# Patient Record
Sex: Male | Born: 1975 | Race: White | Hispanic: No | Marital: Married | Smoking: Never smoker
Health system: Southern US, Community
[De-identification: ages and names within clinical notes are randomized; demographics above are authoritative.]

## PROBLEM LIST (undated history)

## (undated) HISTORY — PX: HERNIA REPAIR: SHX51

---

## 2017-10-25 ENCOUNTER — Emergency Department (HOSPITAL_COMMUNITY)
Admission: EM | Admit: 2017-10-25 | Discharge: 2017-10-26 | Discharge status: Against Medical Advice | Payer: Self-pay | Attending: Emergency Medicine | Admitting: Emergency Medicine

## 2017-10-25 ENCOUNTER — Encounter (HOSPITAL_COMMUNITY): Payer: Self-pay | Admitting: Emergency Medicine

## 2017-10-25 DIAGNOSIS — K56609 Unspecified intestinal obstruction, unspecified as to partial versus complete obstruction: Secondary | ICD-10-CM

## 2017-10-25 DIAGNOSIS — Z79899 Other long term (current) drug therapy: Secondary | ICD-10-CM | POA: Insufficient documentation

## 2017-10-25 DIAGNOSIS — R1033 Periumbilical pain: Secondary | ICD-10-CM | POA: Insufficient documentation

## 2017-10-25 DIAGNOSIS — K56699 Other intestinal obstruction unspecified as to partial versus complete obstruction: Secondary | ICD-10-CM | POA: Insufficient documentation

## 2017-10-25 MED ORDER — SODIUM CHLORIDE 0.9 % IV BOLUS (SEPSIS)
1000.0000 mL | Freq: Once | INTRAVENOUS | Status: AC
  Administered 2017-10-26: 1000 mL via INTRAVENOUS

## 2017-10-25 NOTE — ED Triage Notes (Signed)
Patient reports generalized abdominal pain with multiple emesis onset yesterday , no nausea at arrival , seen at [facility] diagnosed with intestinal blockage sent here for further evaluation , pt. declined blood tests/urinalysis at triage .

## 2017-10-25 NOTE — ED Triage Notes (Signed)
Received report from [facility] physicians, pt admitting to ED for intestinal blockage. States hx of same, but cleared up with IV fluids and steroids. Reports abdominal pain, N/V. Brought disc of images taken today. Wanting to speak to MD regarding plan of care, states he doesn't want to stay in hospital because of cost - pt is self pay.

## 2017-10-25 NOTE — ED Provider Notes (Signed)
[facility] EM[facility]ENC[facility] Provider Note   CSN: [id number] Arrival date & time: 10/25/17  1948     History   Chief Complaint Chief Complaint  Patient presents with  . Abdominal Pain    ? Obstruction     HPI Dysen Sergio Hernandez is a 41 y.o. male.  HPI  This is a 41 year old male who presents with abdominal pain.  Patient reports 1 day history of generalized periumbilical abdominal discomfort.  Initially he states that it was 8 out of 10.  Now it is 5 out of 10.  It comes and goes.  Last bowel movement was yesterday.  He had multiple episodes of non-bloody, nonbilious emesis.  Last vomited at 12 noon.  He was seen at an outpatient clinic.  He had an x-ray which he states was concerning for obstruction.  He does have a history of obstruction and was [place] in [place] years ago.  Only known surgery was a hernia repair.  Denies any fevers, urinary symptoms.  History reviewed. No pertinent past medical history.  There are no active problems to display for this patient.   Past Surgical History:  Procedure Laterality Date  . HERNIA REPAIR         Home Medications    Prior to Admission medications   Medication Sig Start Date End Date Taking? Authorizing Provider  Ascorbic Acid (VITAMIN C) 100 MG tablet Take 100 mg daily by mouth.   Yes [provider]  lansoprazole (PREVACID) 15 MG capsule Take 15 mg daily at 12 noon by mouth.   Yes [provider]  HYDROcodone-acetaminophen (NORCO/VICODIN) 5-325 MG tablet Take 1-2 tablets every 6 (six) hours as needed by mouth. 10/26/17   Kaoir Loree, Mayer Maskerourtney F, MD  ondansetron (ZOFRAN ODT) 4 MG disintegrating tablet Take 1 tablet (4 mg total) every 8 (eight) hours as needed by mouth for nausea or vomiting. 10/26/17   Fabiano Ginley, Mayer Maskerourtney F, MD    Family History No family history on file.  Social History Social History   Tobacco Use  . Smoking status: Never Smoker  . Smokeless tobacco: Never Used    Substance Use Topics  . Alcohol use: No    Frequency: Never  . Drug use: No     Allergies   Patient has no known allergies.   Review of Systems Review of Systems  Constitutional: Negative for fever.  Respiratory: Negative for shortness of breath.   Cardiovascular: Negative for chest pain.  Gastrointestinal: Positive for abdominal pain, nausea and vomiting. Negative for constipation and diarrhea.  Genitourinary: Negative for dysuria.  All other systems reviewed and are negative.    Physical Exam Updated Vital Signs BP 133/88   Pulse 65   Temp 98.6 F (37 C) (Oral)   Resp 18   Ht 6\' 4"  (1.93 m)   Wt 90.7 kg (200 lb)   SpO2 95%   BMI 24.34 kg/m   Physical Exam  Constitutional: He is oriented to person, place, and time. He appears well-developed and well-nourished. He does not appear ill.  HENT:  Head: Normocephalic and atraumatic.  Neck: Neck supple.  Cardiovascular: Normal rate, regular rhythm and normal heart sounds.  No murmur heard. Pulmonary/Chest: Effort normal and breath sounds normal. No respiratory distress. He has no wheezes.  Abdominal: Soft. Bowel sounds are normal. There is tenderness in the periumbilical area. There is no rebound and negative Murphy's sign. No hernia.  Musculoskeletal: He exhibits no edema.  Lymphadenopathy:    He has no  cervical adenopathy.  Neurological: He is alert and oriented to person, place, and time.  Skin: Skin is warm and dry.  Psychiatric: He has a normal mood and affect.  Nursing note and vitals reviewed.    ED Treatments / Results  Labs (all labs ordered are listed, but only abnormal results are displayed) Labs Reviewed  CBC WITH DIFFERENTIAL/PLATELET - Abnormal; Notable for the following components:      Result Value   WBC 13.4 (*)    Neutro Abs 10.1 (*)    All other components within normal limits  COMPREHENSIVE METABOLIC PANEL - Abnormal; Notable for the following components:   Sodium 132 (*)    Chloride 97  (*)    Glucose, Bld 113 (*)    ALT 15 (*)    All other components within normal limits    EKG  EKG Interpretation None       Radiology Dg Abdomen Acute W/chest  Result Date: 10/26/2017 CLINICAL DATA:  Abdominal pain EXAM: DG ABDOMEN ACUTE W/ 1V CHEST COMPARISON:  10/25/2017 FINDINGS: Single-view chest demonstrates no acute consolidation or effusion. Normal cardiomediastinal silhouette. No pneumothorax. Supine and upright views of the abdomen demonstrate no free air beneath the diaphragm. There are dilated loops of central small bowel measuring up to 5 cm with contiguous fluid levels consistent with mechanical small bowel obstruction. Scattered bowel gas in the colon. No abnormal calcification IMPRESSION: 1. No radiographic evidence for acute cardiopulmonary abnormality 2. Findings consistent with small bowel obstruction. Electronically Signed   By: Jasmine Pang M.D.   On: 10/26/2017 00:23    Procedures Procedures (including critical care time)  Medications Ordered in ED Medications  sodium chloride 0.9 % bolus 1,000 mL (0 mLs Intravenous Stopped 10/26/17 0102)     Initial Impression / Assessment and Plan / ED Course  I have reviewed the triage vital signs and the nursing notes.  Pertinent labs & imaging results that were available during my care of the patient were reviewed by me and considered in my medical decision making (see chart for details).  Clinical Course as of Oct 27 123  Wed Oct 26, 2017  1610 I had an extensive discussion with the patient regarding his x-ray.  X-ray is concerning for obstructive pattern.  Patient is very concerned about cost and states that he feels somewhat better.  He has not vomited in over 12 hours.  Discussed with him that my recommendation would be admission for observation for clearance of possible obstruction.  Will attempt to avoid CT scanning to avoid additional cost.  Patient would really like to go home.  I discussed with him that this is  not standard of care but that I understand his concerns regarding cost.  Given that he feels improved, will attempt a p.o. challenge.  If he in fact does decide to go home, he will need to sign out AGAINST MEDICAL ADVICE but was encouraged to return if he has worsening symptoms.  [CH]    Clinical Course User Index [CH] Sallie Staron, Mayer Masker, MD    Patient presents with concerns for abdominal pain and likely obstruction based on x-rays at outside facility.  Currently nontoxic-appearing.  Vital signs are reassuring.  Abdominal exam is fairly benign.  He has not vomited in 12 hours.  Patient is very concerned regarding cost of workup and possible admission.  I discussed with him that we would minimize this as much as possible to keep him safe.  Patient was given fluids.  Basic lab  work obtained.  Nonspecific leukocytosis.  Otherwise fairly reassuring.  X-ray confirms likely obstructive pattern.  Patient has not had any vomiting and has not required any pain medication.  I discussed with him foregoing CT scan at this time for observation admission.  See detailed discussion above.  Patient would like to be discharged home.  I discussed with him that he would have to leave AGAINST MEDICAL ADVICE as my recommendation and standard of care would be admission and observation.  Patient understands the risks and benefits.  He is able to tolerate some fluids without vomiting.  Recommend clear diet for the next 24-48 hours.  Patient will be given a short course of pain and nausea medication.  He was encouraged to return for any new or worsening symptoms.  After history, exam, and medical workup I feel the patient has been appropriately medically screened and is safe for discharge home. Pertinent diagnoses were discussed with the patient. Patient was given return precautions.   Final Clinical Impressions(s) / ED Diagnoses   Final diagnoses:  Periumbilical abdominal pain  SBO (small bowel obstruction) [facility](HCC)    ED  Discharge Orders        Ordered    HYDROcodone-acetaminophen (NORCO/VICODIN) 5-325 MG tablet  Every 6 hours PRN     10/26/17 0122    ondansetron (ZOFRAN ODT) 4 MG disintegrating tablet  Every 8 hours PRN     10/26/17 0122       Shon BatonHorton, Vercie Pokorny F, MD 10/26/17 0126

## 2017-10-26 ENCOUNTER — Emergency Department (HOSPITAL_COMMUNITY): Payer: Self-pay

## 2017-10-26 LAB — CBC WITH DIFFERENTIAL/PLATELET
BASOS ABS: 0 10*3/uL (ref 0.0–0.1)
Basophils Relative: 0 %
EOS PCT: 0 %
Eosinophils Absolute: 0.1 10*3/uL (ref 0.0–0.7)
HCT: 45.1 % (ref 39.0–52.0)
Hemoglobin: 14.7 g/dL (ref 13.0–17.0)
LYMPHS PCT: 18 %
Lymphs Abs: 2.4 10*3/uL (ref 0.7–4.0)
MCH: 26.4 pg (ref 26.0–34.0)
MCHC: 32.6 g/dL (ref 30.0–36.0)
MCV: 81 fL (ref 78.0–100.0)
MONO ABS: 0.8 10*3/uL (ref 0.1–1.0)
Monocytes Relative: 6 %
Neutro Abs: 10.1 10*3/uL — ABNORMAL HIGH (ref 1.7–7.7)
Neutrophils Relative %: 76 %
PLATELETS: 294 10*3/uL (ref 150–400)
RBC: 5.57 MIL/uL (ref 4.22–5.81)
RDW: 13.8 % (ref 11.5–15.5)
WBC: 13.4 10*3/uL — ABNORMAL HIGH (ref 4.0–10.5)

## 2017-10-26 LAB — COMPREHENSIVE METABOLIC PANEL
ALT: 15 U/L — ABNORMAL LOW (ref 17–63)
ANION GAP: 9 (ref 5–15)
AST: 17 U/L (ref 15–41)
Albumin: 3.6 g/dL (ref 3.5–5.0)
Alkaline Phosphatase: 55 U/L (ref 38–126)
BUN: 7 mg/dL (ref 6–20)
CHLORIDE: 97 mmol/L — AB (ref 101–111)
CO2: 26 mmol/L (ref 22–32)
Calcium: 9 mg/dL (ref 8.9–10.3)
Creatinine, Ser: 1.08 mg/dL (ref 0.61–1.24)
GFR calc non Af Amer: >60 mL/min (ref >60)
Glucose, Bld: 113 mg/dL — ABNORMAL HIGH (ref 65–99)
POTASSIUM: 4.2 mmol/L (ref 3.5–5.1)
Sodium: 132 mmol/L — ABNORMAL LOW (ref 135–145)
Total Bilirubin: 0.9 mg/dL (ref 0.3–1.2)
Total Protein: 7.5 g/dL (ref 6.5–8.1)

## 2017-10-26 IMAGING — CR DG ABDOMEN ACUTE W/ 1V CHEST
4 series · 4 of 4 positions shown · non-contrast
Comparison: [DATE]

CLINICAL DATA: Abdominal pain

EXAM:
DG ABDOMEN ACUTE W/ 1V CHEST

[chest pa]
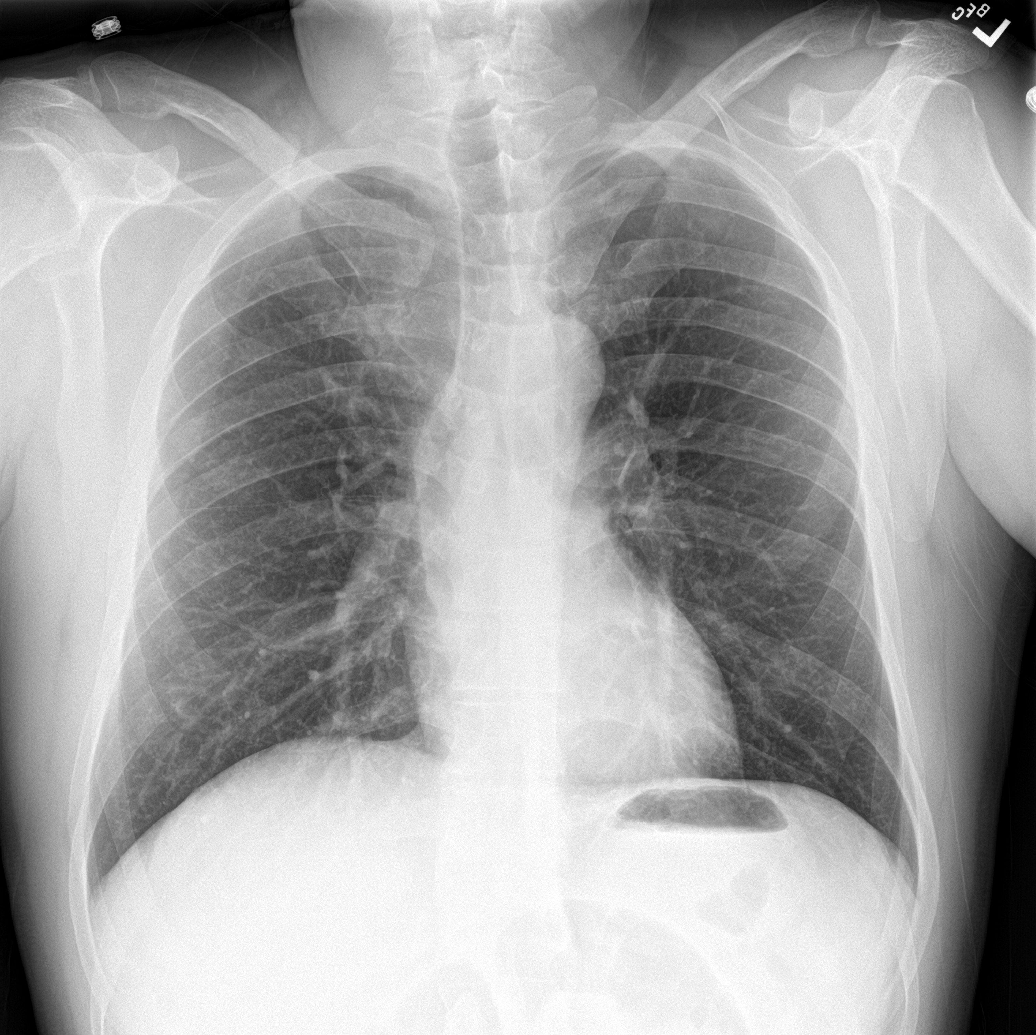

[abdomen erect]
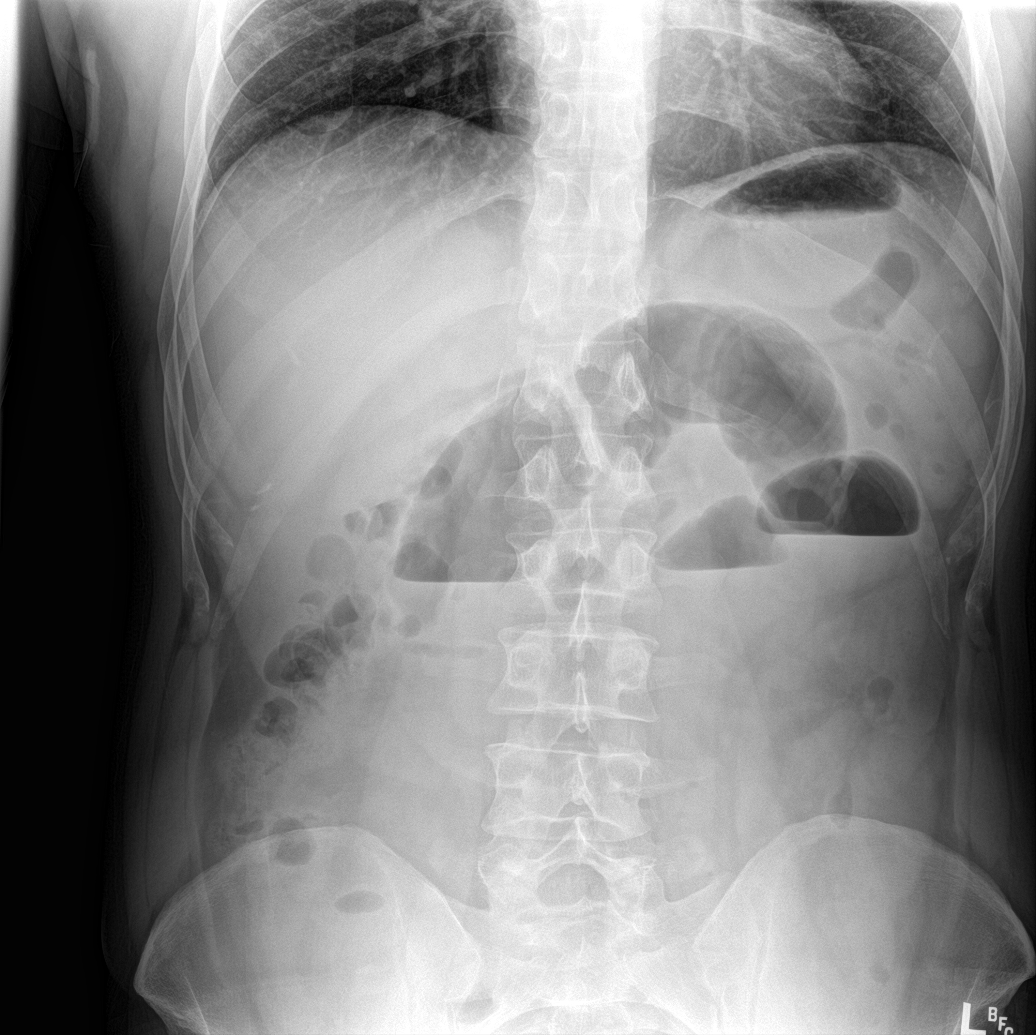

[abdomen supine (1 of 2)]
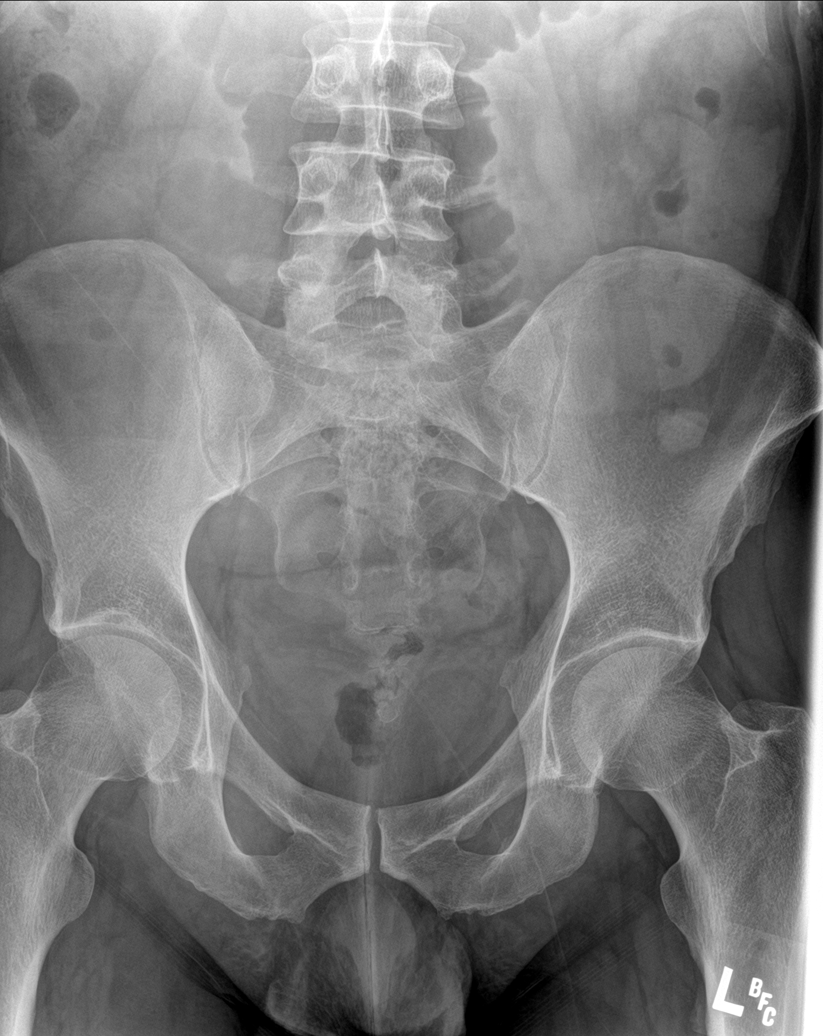

[abdomen supine (2 of 2)]
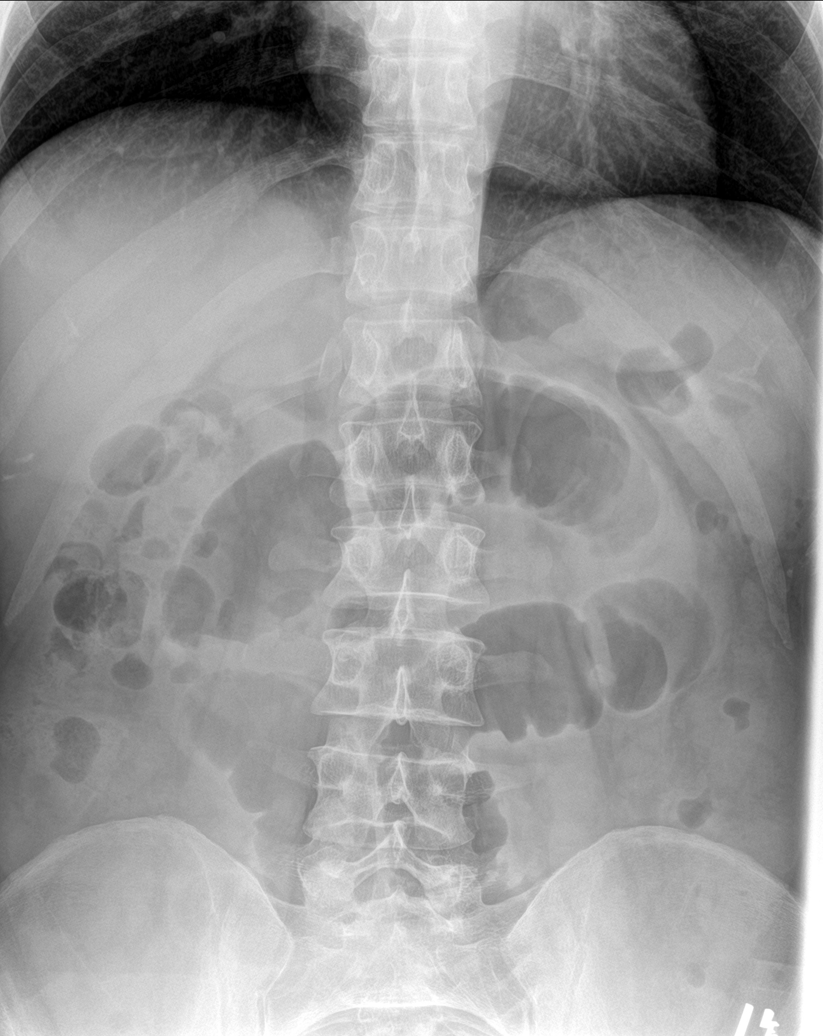

[4 of 4 positions shown; findings below may reference images not displayed]

FINDINGS: Single-view chest demonstrates no acute consolidation or effusion.
Normal cardiomediastinal silhouette. No pneumothorax.

Supine and upright views of the abdomen demonstrate no free air
beneath the diaphragm. There are dilated loops of central small
bowel measuring up to 5 cm with contiguous fluid levels consistent
with mechanical small bowel obstruction. Scattered bowel gas in the
colon. No abnormal calcification
IMPRESSION: 1. No radiographic evidence for acute cardiopulmonary abnormality
2. Findings consistent with small bowel obstruction.

## 2017-10-26 MED ORDER — HYDROCODONE-ACETAMINOPHEN 5-325 MG PO TABS: 1.0000 | ORAL_TABLET | Freq: Four times a day (QID) | ORAL | 0 refills | Status: DC | PRN

## 2017-10-26 MED ORDER — ONDANSETRON 4 MG PO TBDP: 4.0000 mg | ORAL_TABLET | Freq: Three times a day (TID) | ORAL | 0 refills | Status: DC | PRN

## 2017-10-26 NOTE — ED Notes (Signed)
Pt tolerating fluids, EDP aware. Pt will not stay d/t financial cost. Pt will be signing out AMA. Pt understands the risks of leaving.

## 2017-10-26 NOTE — Discharge Instructions (Signed)
You are seen today for abdominal pain.  You likely have a early small bowel obstruction.  The recommendation was to admit you to the hospital.  However, you elected to go home.  Clear diet recommended for the next 24-48 hours.  Broths, soups, fluids encouraged.  You will be given some pain and nausea medication.  If you have any new or worsening symptoms including refractory vomiting, worsening pain, you should be reevaluated immediately.

## 2017-10-26 NOTE — ED Notes (Signed)
Pt given water for PO challenge, will continue to monitor.

## 2017-10-26 NOTE — ED Notes (Signed)
EDP at bedside  

## 2021-07-17 ENCOUNTER — Emergency Department (HOSPITAL_COMMUNITY)
Admission: EM | Admit: 2021-07-17 | Discharge: 2021-07-17 | Disposition: A | Discharge status: Home / Self Care | Payer: Federal, State, Local not specified - PPO | Attending: Emergency Medicine | Admitting: Emergency Medicine

## 2021-07-17 ENCOUNTER — Emergency Department (HOSPITAL_COMMUNITY): Payer: Federal, State, Local not specified - PPO

## 2021-07-17 ENCOUNTER — Encounter (HOSPITAL_COMMUNITY): Payer: Self-pay | Admitting: *Deleted

## 2021-07-17 ENCOUNTER — Other Ambulatory Visit: Payer: Self-pay

## 2021-07-17 DIAGNOSIS — S6992XA Unspecified injury of left wrist, hand and finger(s), initial encounter: Secondary | ICD-10-CM | POA: Diagnosis not present

## 2021-07-17 DIAGNOSIS — S299XXA Unspecified injury of thorax, initial encounter: Secondary | ICD-10-CM | POA: Diagnosis not present

## 2021-07-17 DIAGNOSIS — R519 Headache, unspecified: Secondary | ICD-10-CM | POA: Insufficient documentation

## 2021-07-17 DIAGNOSIS — M791 Myalgia, unspecified site: Secondary | ICD-10-CM | POA: Diagnosis not present

## 2021-07-17 DIAGNOSIS — R110 Nausea: Secondary | ICD-10-CM | POA: Diagnosis not present

## 2021-07-17 DIAGNOSIS — Y92410 Unspecified street and highway as the place of occurrence of the external cause: Secondary | ICD-10-CM | POA: Diagnosis not present

## 2021-07-17 DIAGNOSIS — S4992XA Unspecified injury of left shoulder and upper arm, initial encounter: Secondary | ICD-10-CM | POA: Insufficient documentation

## 2021-07-17 DIAGNOSIS — M7918 Myalgia, other site: Secondary | ICD-10-CM
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
## 2021-07-17 MED ORDER — IBUPROFEN 600 MG PO TABS: 600.0000 mg | ORAL_TABLET | Freq: Three times a day (TID) | ORAL | 0 refills | Status: DC | PRN

## 2021-07-17 MED ORDER — CYCLOBENZAPRINE HCL 5 MG PO TABS: 5.0000 mg | ORAL_TABLET | Freq: Three times a day (TID) | ORAL | 0 refills | Status: DC | PRN
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
## 2021-07-31 ENCOUNTER — Encounter (HOSPITAL_BASED_OUTPATIENT_CLINIC_OR_DEPARTMENT_OTHER): Payer: Self-pay

## 2021-07-31 ENCOUNTER — Encounter: Payer: Self-pay | Admitting: Family Medicine

## 2021-07-31 ENCOUNTER — Ambulatory Visit: Payer: Federal, State, Local not specified - PPO | Admitting: Family Medicine

## 2021-07-31 ENCOUNTER — Encounter: Payer: Self-pay | Admitting: Neurology

## 2021-07-31 ENCOUNTER — Other Ambulatory Visit: Payer: Self-pay

## 2021-07-31 ENCOUNTER — Ambulatory Visit (HOSPITAL_BASED_OUTPATIENT_CLINIC_OR_DEPARTMENT_OTHER)
Admission: RE | Admit: 2021-07-31 | Discharge: 2021-07-31 | Disposition: A | Discharge status: Home / Self Care | Payer: Federal, State, Local not specified - PPO | Source: Ambulatory Visit | Attending: Family Medicine | Admitting: Family Medicine

## 2021-07-31 ENCOUNTER — Telehealth: Payer: Self-pay

## 2021-07-31 ENCOUNTER — Ambulatory Visit (INDEPENDENT_AMBULATORY_CARE_PROVIDER_SITE_OTHER)
Admission: RE | Admit: 2021-07-31 | Discharge: 2021-07-31 | Disposition: A | Discharge status: Home / Self Care | Payer: Federal, State, Local not specified - PPO | Source: Ambulatory Visit | Attending: Family Medicine | Admitting: Family Medicine

## 2021-07-31 VITALS — BP 128/74 | HR 69 | Temp 98.1°F | Resp 17 | Ht 76.0 in | Wt 189.6 lb

## 2021-07-31 DIAGNOSIS — R110 Nausea: Secondary | ICD-10-CM

## 2021-07-31 DIAGNOSIS — R531 Weakness: Secondary | ICD-10-CM

## 2021-07-31 DIAGNOSIS — H538 Other visual disturbances: Secondary | ICD-10-CM

## 2021-07-31 DIAGNOSIS — R1084 Generalized abdominal pain: Secondary | ICD-10-CM | POA: Diagnosis not present

## 2021-07-31 DIAGNOSIS — M898X1 Other specified disorders of bone, shoulder: Secondary | ICD-10-CM

## 2021-07-31 DIAGNOSIS — M25532 Pain in left wrist: Secondary | ICD-10-CM

## 2021-07-31 DIAGNOSIS — G44319 Acute post-traumatic headache, not intractable: Secondary | ICD-10-CM | POA: Insufficient documentation

## 2021-07-31 DIAGNOSIS — H9312 Tinnitus, left ear: Secondary | ICD-10-CM

## 2021-07-31 DIAGNOSIS — M25512 Pain in left shoulder: Secondary | ICD-10-CM | POA: Diagnosis not present

## 2021-07-31 LAB — COMPREHENSIVE METABOLIC PANEL
ALT: 14 U/L (ref 0–53)
AST: 15 U/L (ref 0–37)
Albumin: 4.1 g/dL (ref 3.5–5.2)
Alkaline Phosphatase: 52 U/L (ref 39–117)
BUN: 11 mg/dL (ref 6–23)
CO2: 28 mEq/L (ref 19–32)
Calcium: 9.4 mg/dL (ref 8.4–10.5)
Chloride: 102 mEq/L (ref 96–112)
Creatinine, Ser: 1.03 mg/dL (ref 0.40–1.50)
GFR: 86.85 mL/min (ref >60.00)
Glucose, Bld: 79 mg/dL (ref 70–99)
Potassium: 4.7 mEq/L (ref 3.5–5.1)
Sodium: 139 mEq/L (ref 135–145)
Total Bilirubin: 0.6 mg/dL (ref 0.2–1.2)
Total Protein: 7.4 g/dL (ref 6.0–8.3)

## 2021-07-31 LAB — CBC
HCT: 44.7 % (ref 39.0–52.0)
Hemoglobin: 14.6 g/dL (ref 13.0–17.0)
MCHC: 32.7 g/dL (ref 30.0–36.0)
MCV: 81.9 fl (ref 78.0–100.0)
Platelets: 332 10*3/uL (ref 150.0–400.0)
RBC: 5.46 Mil/uL (ref 4.22–5.81)
RDW: 13.8 % (ref 11.5–15.5)
WBC: 7.5 10*3/uL (ref 4.0–10.5)

## 2021-07-31 LAB — GLUCOSE, POCT (MANUAL RESULT ENTRY): POC Glucose: 99 mg/dl (ref 70–99)

## 2021-07-31 LAB — LIPASE: Lipase: 29 U/L (ref 11.0–59.0)
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
## 2021-07-31 NOTE — Telephone Encounter (Signed)
Called patient to see what he needed. He states he thought he was supposed to pick up medications from the pharmacy. I don't see any medications that were sent, and note from today doesn't state that medications were sent in. Please advise.

## 2021-07-31 NOTE — Telephone Encounter (Signed)
Pt  is wanting to know if he is to continue taking the medication that he  cyclobenzaprine (FLEXERIL) 5 MG tablet  ibuprofen (ADVIL) 600 MG tablet   Pt call back [phone]
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
## 2021-08-02 MED ORDER — CYCLOBENZAPRINE HCL 5 MG PO TABS: 5.0000 mg | ORAL_TABLET | Freq: Three times a day (TID) | ORAL | 0 refills | Status: DC | PRN

## 2021-08-02 NOTE — Telephone Encounter (Signed)
See xray report for instructions, but I will send temporary rx for flexeril.

## 2021-08-03 NOTE — Telephone Encounter (Signed)
LMOVM for imaging results

## 2021-09-02 ENCOUNTER — Other Ambulatory Visit: Payer: Self-pay

## 2021-09-02 ENCOUNTER — Encounter: Payer: Self-pay | Admitting: Family Medicine

## 2021-09-02 ENCOUNTER — Ambulatory Visit (INDEPENDENT_AMBULATORY_CARE_PROVIDER_SITE_OTHER): Payer: Federal, State, Local not specified - PPO | Admitting: Family Medicine

## 2021-09-02 VITALS — BP 112/70 | HR 63 | Temp 98.3°F | Resp 16 | Ht 76.0 in | Wt 189.0 lb

## 2021-09-02 DIAGNOSIS — G44309 Post-traumatic headache, unspecified, not intractable: Secondary | ICD-10-CM

## 2021-09-02 DIAGNOSIS — H538 Other visual disturbances: Secondary | ICD-10-CM

## 2021-09-02 DIAGNOSIS — G4700 Insomnia, unspecified: Secondary | ICD-10-CM | POA: Diagnosis not present

## 2021-09-02 DIAGNOSIS — R29818 Other symptoms and signs involving the nervous system: Secondary | ICD-10-CM | POA: Diagnosis not present

## 2021-09-02 MED ORDER — AMITRIPTYLINE HCL 10 MG PO TABS: 10.0000 mg | ORAL_TABLET | Freq: Every day | ORAL | 1 refills | Status: DC
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
## 2021-09-04 ENCOUNTER — Other Ambulatory Visit: Payer: Self-pay

## 2021-09-04 ENCOUNTER — Ambulatory Visit (INDEPENDENT_AMBULATORY_CARE_PROVIDER_SITE_OTHER): Payer: Federal, State, Local not specified - PPO | Admitting: Orthopaedic Surgery

## 2021-09-04 ENCOUNTER — Encounter (HOSPITAL_BASED_OUTPATIENT_CLINIC_OR_DEPARTMENT_OTHER): Payer: Self-pay | Admitting: Orthopaedic Surgery

## 2021-09-04 VITALS — BP 124/80 | Ht 69.0 in | Wt 189.0 lb

## 2021-09-04 DIAGNOSIS — M25512 Pain in left shoulder: Secondary | ICD-10-CM | POA: Diagnosis not present
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
## 2021-09-09 ENCOUNTER — Other Ambulatory Visit: Payer: Self-pay

## 2021-09-09 ENCOUNTER — Ambulatory Visit (INDEPENDENT_AMBULATORY_CARE_PROVIDER_SITE_OTHER): Payer: Federal, State, Local not specified - PPO | Admitting: Otolaryngology

## 2021-09-09 DIAGNOSIS — H9312 Tinnitus, left ear: Secondary | ICD-10-CM
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
## 2021-09-10 ENCOUNTER — Encounter (INDEPENDENT_AMBULATORY_CARE_PROVIDER_SITE_OTHER): Payer: Self-pay

## 2021-09-16 ENCOUNTER — Ambulatory Visit: Payer: Federal, State, Local not specified - PPO | Admitting: Family Medicine

## 2021-09-21 ENCOUNTER — Ambulatory Visit (HOSPITAL_BASED_OUTPATIENT_CLINIC_OR_DEPARTMENT_OTHER): Payer: Federal, State, Local not specified - PPO | Admitting: Orthopaedic Surgery

## 2021-09-26 ENCOUNTER — Ambulatory Visit
Admission: RE | Admit: 2021-09-26 | Discharge: 2021-09-26 | Disposition: A | Discharge status: Home / Self Care | Payer: Federal, State, Local not specified - PPO | Source: Ambulatory Visit | Attending: Family Medicine | Admitting: Family Medicine

## 2021-09-26 ENCOUNTER — Ambulatory Visit
Admission: RE | Admit: 2021-09-26 | Discharge: 2021-09-26 | Disposition: A | Discharge status: Home / Self Care | Payer: Federal, State, Local not specified - PPO | Source: Ambulatory Visit | Attending: Orthopaedic Surgery | Admitting: Orthopaedic Surgery

## 2021-09-26 ENCOUNTER — Other Ambulatory Visit: Payer: Self-pay

## 2021-09-26 DIAGNOSIS — R29818 Other symptoms and signs involving the nervous system: Secondary | ICD-10-CM

## 2021-09-26 DIAGNOSIS — M25512 Pain in left shoulder: Secondary | ICD-10-CM

## 2021-09-26 DIAGNOSIS — H538 Other visual disturbances: Secondary | ICD-10-CM

## 2021-09-26 DIAGNOSIS — G44309 Post-traumatic headache, unspecified, not intractable: Secondary | ICD-10-CM
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
## 2021-09-26 IMAGING — MR MR HEAD WO/W CM
12 series · 48 of 48 positions shown · IV contrast (17ml Multihance)
Comparison: CT head [DATE].

CLINICAL DATA: Post-traumatic headache, not intractable,
unspecified chronicity pattern [WW] ([WW]-CM)

Blurred vision, bilateral [WW] ([WW]-CM)
Difficulty balancing [WW] ([WW]-CM)
EXAM:
MRI HEAD WITHOUT AND WITH CONTRAST
TECHNIQUE: Multiplanar, multiecho pulse sequences of the brain and surrounding
structures were obtained without and with intravenous contrast.
CONTRAST:  17mL MULTIHANCE GADOBENATE DIMEGLUMINE 529 MG/ML IV SOLN

[Series 3: T1 · sagittal · 5.0mm · 0.53mm/px · 1 of 25 slices shown]
[im 1/25]
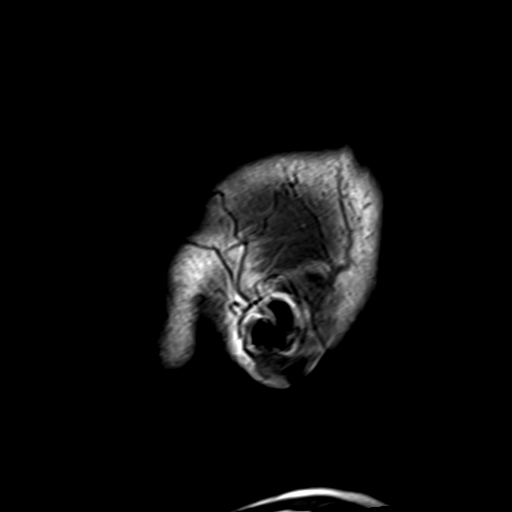

[Series 4: ax ep2d_diff_3 · axial · 3.0mm · 2.11mm/px · z∈[-103,+57]mm · 6 of 109 slices shown]
[im 1/109]
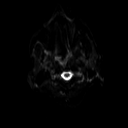
[im 22/109]
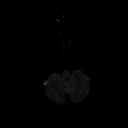
[im 44/109]
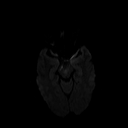
[im 65/109]
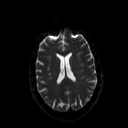
[im 87/109]
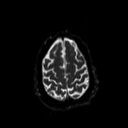
[im 109/109]
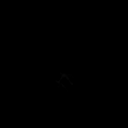

[Series 5: ax ep2d_diff_3_adc · axial · 3.0mm · 2.11mm/px · z∈[-103,+57]mm · 3 of 55 slices shown]
[im 1/55]
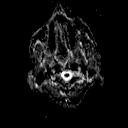
[im 28/55]
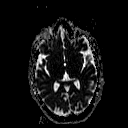
[im 55/55]
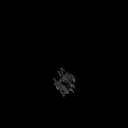

[Series 6: cor ep2d_diff · coronal · 5.0mm · 1.77mm/px · 4 of 61 slices shown]
[im 1/61]
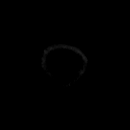
[im 21/61]
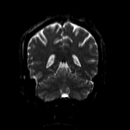
[im 41/61]
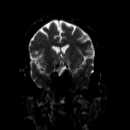
[im 61/61]
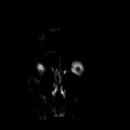

[Series 7: cor ep2d_diff_adc · coronal · 5.0mm · 1.77mm/px · 2 of 31 slices shown]
[im 1/31]
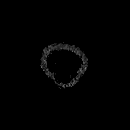
[im 31/31]
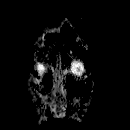

[Series 9: swi_images · axial · 2.0mm · 1.09mm/px · z∈[-99,+56]mm · 5 of 80 slices shown]
[im 1/80]
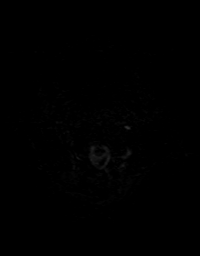
[im 20/80]
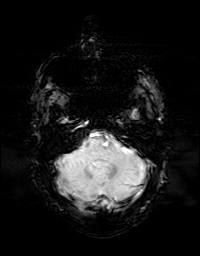
[im 40/80]
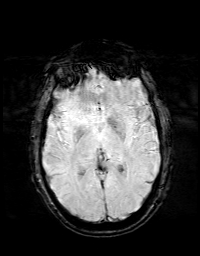
[im 60/80]
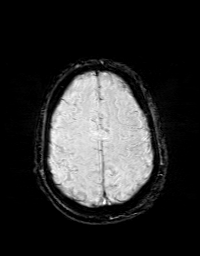
[im 80/80]
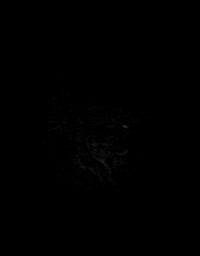

[Series 10: FLAIR · axial · 3.0mm · 0.53mm/px · z∈[-107,+54]mm · 3 of 43 slices shown]
[im 1/43]
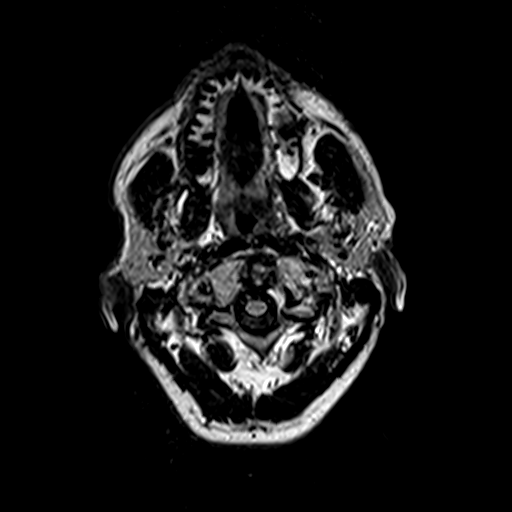
[im 22/43]
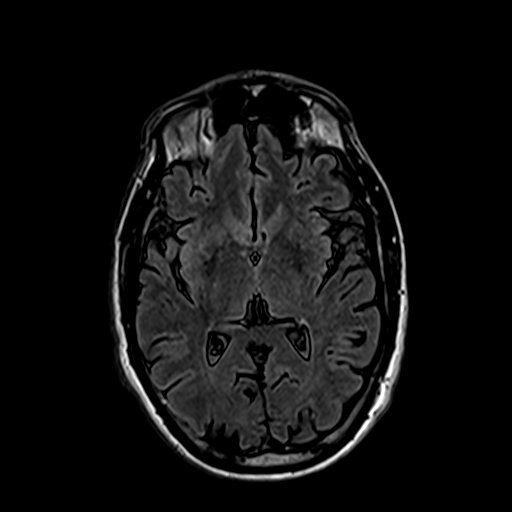
[im 43/43]
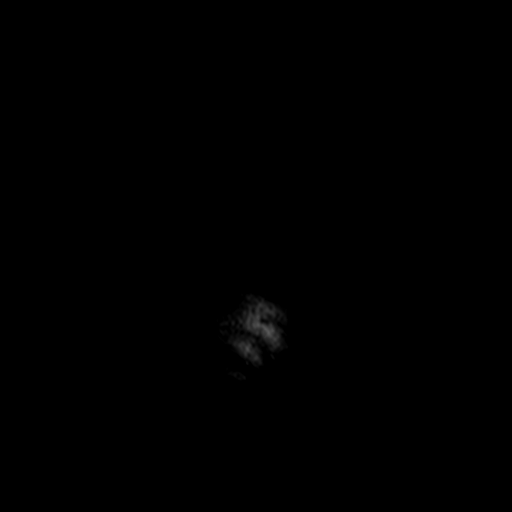

[Series 11: T2 · axial · 5.0mm · 0.84mm/px · z∈[-104,+61]mm · 2 of 29 slices shown (1 of 2)]
[im 1/29]
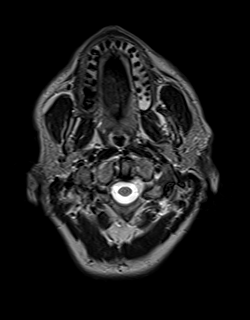
[im 29/29]
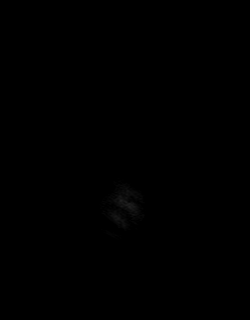

[Series 12: t1_mpr_tra · axial · 1.0mm · 0.84mm/px · z∈[-100,+56]mm · 9 of 160 slices shown]
[im 1/160]
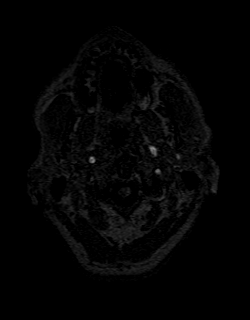
[im 20/160]
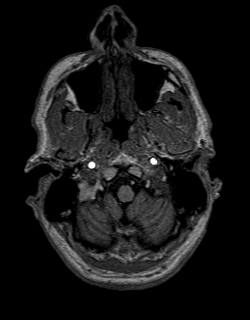
[im 40/160]
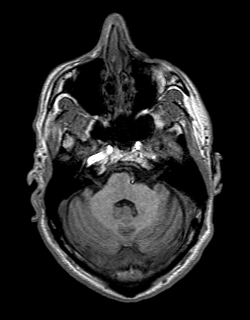
[im 60/160]
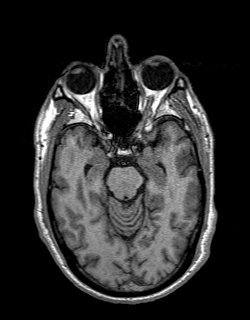
[im 80/160]
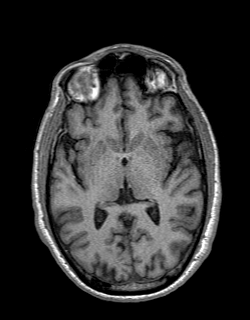
[im 100/160]
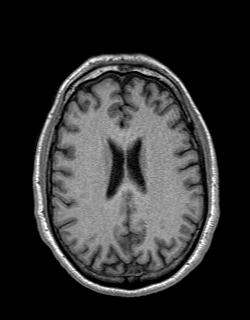
[im 120/160]
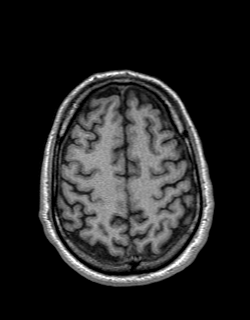
[im 140/160]
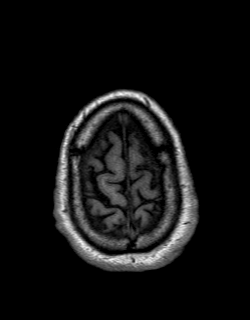
[im 160/160]
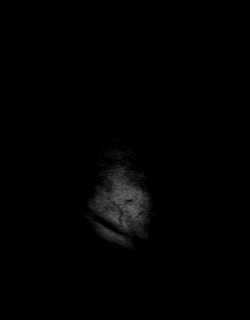

[Series 13: T2 · coronal · 5.0mm · 0.43mm/px · 2 of 33 slices shown (2 of 2)]
[im 1/33]
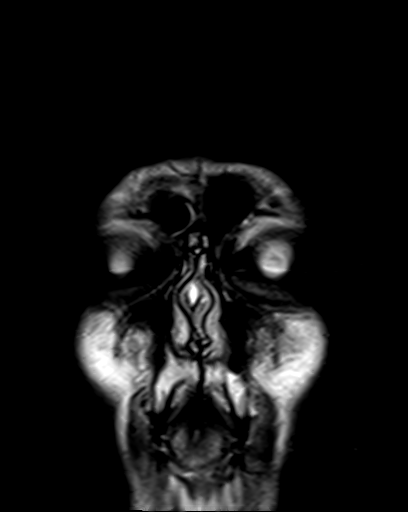
[im 33/33]
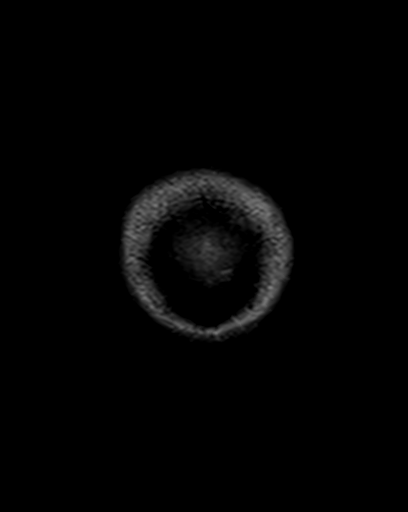

[Series 14: post t1_mpr_tra · axial · 1.0mm · 0.84mm/px · z∈[-100,+56]mm · 9 of 160 slices shown]
[im 1/160]
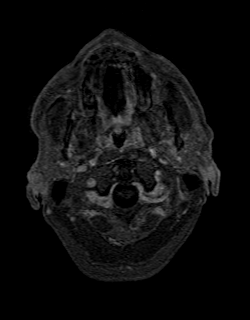
[im 20/160]
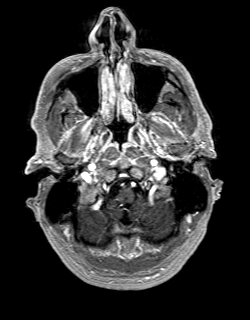
[im 40/160]
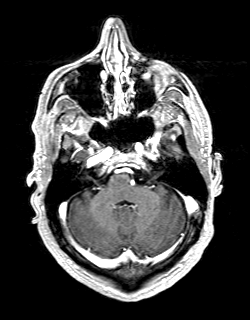
[im 60/160]
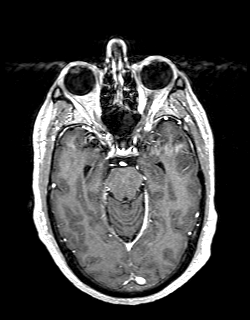
[im 80/160]
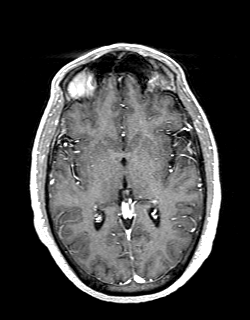
[im 100/160]
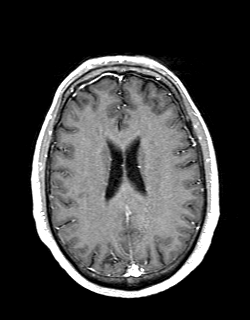
[im 120/160]
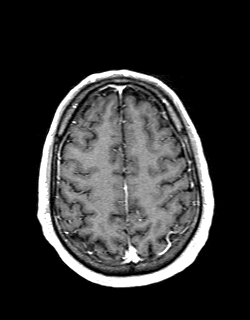
[im 140/160]
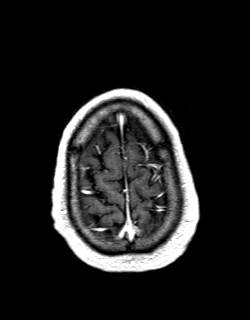
[im 160/160]
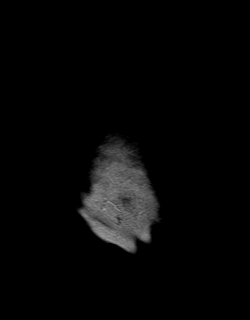

[Series 15: T1 post-contrast · coronal · 5.0mm · 0.90mm/px · 2 of 33 slices shown]
[im 1/33]
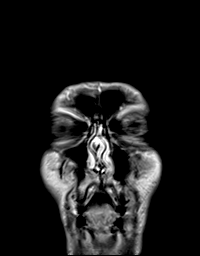
[im 33/33]
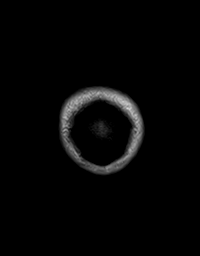

[48 of 48 positions shown; findings below may reference images not displayed]

FINDINGS: Brain: No acute infarction, hemorrhage, hydrocephalus, extra-axial
collection or mass lesion. There are a couple punctate T2
hyperintensities in the white matter, nonspecific but within normal
limits for patient age. No abnormal enhancement.

Vascular: Major arterial flow voids are maintained at the skull
base.

Skull and upper cervical spine: Normal marrow signal.

Sinuses/Orbits: Mild ethmoid air cell mucosal thickening. No
air-fluid levels. Unremarkable orbits.

Other: No mastoid effusions.
IMPRESSION: Normal brain MRI for patient age.  No acute abnormality.

## 2021-09-26 MED ORDER — GADOBENATE DIMEGLUMINE 529 MG/ML IV SOLN
17.0000 mL | Freq: Once | INTRAVENOUS | Status: AC | PRN
  Administered 2021-09-26: 17 mL via INTRAVENOUS

## 2021-09-29 ENCOUNTER — Ambulatory Visit (INDEPENDENT_AMBULATORY_CARE_PROVIDER_SITE_OTHER): Payer: Federal, State, Local not specified - PPO | Admitting: Orthopaedic Surgery

## 2021-09-29 ENCOUNTER — Other Ambulatory Visit: Payer: Self-pay

## 2021-09-29 VITALS — Ht 69.0 in | Wt 190.0 lb

## 2021-09-29 DIAGNOSIS — M25512 Pain in left shoulder: Secondary | ICD-10-CM | POA: Diagnosis not present
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
## 2021-09-30 ENCOUNTER — Ambulatory Visit (INDEPENDENT_AMBULATORY_CARE_PROVIDER_SITE_OTHER): Payer: Federal, State, Local not specified - PPO | Admitting: Family Medicine

## 2021-09-30 ENCOUNTER — Encounter: Payer: Self-pay | Admitting: Family Medicine

## 2021-09-30 VITALS — BP 126/70 | HR 87 | Temp 98.3°F | Resp 16 | Ht 69.0 in | Wt 193.8 lb

## 2021-09-30 DIAGNOSIS — G44319 Acute post-traumatic headache, not intractable: Secondary | ICD-10-CM

## 2021-09-30 DIAGNOSIS — M75112 Incomplete rotator cuff tear or rupture of left shoulder, not specified as traumatic: Secondary | ICD-10-CM

## 2021-09-30 DIAGNOSIS — G4700 Insomnia, unspecified: Secondary | ICD-10-CM | POA: Diagnosis not present

## 2021-09-30 DIAGNOSIS — M25512 Pain in left shoulder: Secondary | ICD-10-CM

## 2021-09-30 DIAGNOSIS — G44309 Post-traumatic headache, unspecified, not intractable: Secondary | ICD-10-CM

## 2021-09-30 MED ORDER — AMITRIPTYLINE HCL 10 MG PO TABS: 10.0000 mg | ORAL_TABLET | Freq: Every day | ORAL | 1 refills | Status: DC
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
## 2021-09-30 NOTE — Patient Instructions (Addendum)
You can try 2 of the amitriptyline at night to see if that helps headaches. If worse side effects on 2 pills, return to one.  Keep follow up with neurologist on 11/2 for your headaches.   Continue to discuss your thoughts and concerns about your shoulder with your surgeon, but physical therapy may be helpful for now as well.   Return to the clinic or go to the nearest emergency room if any of your symptoms worsen or new symptoms occur.  Thanks for coming in today

## 2021-10-13 ENCOUNTER — Other Ambulatory Visit: Payer: Self-pay

## 2021-10-13 ENCOUNTER — Encounter (HOSPITAL_BASED_OUTPATIENT_CLINIC_OR_DEPARTMENT_OTHER): Payer: Self-pay | Admitting: Physical Therapy

## 2021-10-13 ENCOUNTER — Encounter (HOSPITAL_BASED_OUTPATIENT_CLINIC_OR_DEPARTMENT_OTHER): Payer: Federal, State, Local not specified - PPO | Attending: Physical Therapy | Admitting: Physical Therapy

## 2021-10-13 DIAGNOSIS — M25612 Stiffness of left shoulder, not elsewhere classified: Secondary | ICD-10-CM | POA: Diagnosis present

## 2021-10-13 DIAGNOSIS — M25512 Pain in left shoulder: Secondary | ICD-10-CM | POA: Diagnosis not present

## 2021-10-13 DIAGNOSIS — G8929 Other chronic pain: Secondary | ICD-10-CM | POA: Insufficient documentation

## 2021-10-13 DIAGNOSIS — M6281 Muscle weakness (generalized): Secondary | ICD-10-CM | POA: Insufficient documentation
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
## 2021-10-15 ENCOUNTER — Other Ambulatory Visit: Payer: Self-pay

## 2021-10-15 ENCOUNTER — Encounter (HOSPITAL_BASED_OUTPATIENT_CLINIC_OR_DEPARTMENT_OTHER): Payer: Self-pay | Admitting: Physical Therapy

## 2021-10-15 ENCOUNTER — Ambulatory Visit (HOSPITAL_BASED_OUTPATIENT_CLINIC_OR_DEPARTMENT_OTHER): Payer: Federal, State, Local not specified - PPO | Attending: Orthopaedic Surgery | Admitting: Physical Therapy

## 2021-10-15 DIAGNOSIS — M6281 Muscle weakness (generalized): Secondary | ICD-10-CM | POA: Diagnosis present

## 2021-10-15 DIAGNOSIS — M25612 Stiffness of left shoulder, not elsewhere classified: Secondary | ICD-10-CM | POA: Insufficient documentation

## 2021-10-15 DIAGNOSIS — G8929 Other chronic pain: Secondary | ICD-10-CM | POA: Diagnosis present

## 2021-10-15 DIAGNOSIS — M25512 Pain in left shoulder: Secondary | ICD-10-CM | POA: Insufficient documentation

## 2021-10-15 NOTE — Therapy (Signed)
OUTPATIENT PHYSICAL THERAPY TREATMENT NOTE   Patient Name: Sergio Hernandez MRN: [id number] DOB:1976-04-17, 45 y.o., male Today's Date: 10/15/2021  PCP: Shade Flood, MD REFERRING PROVIDER: Huel Cote, MD   PT End of Session - 10/15/21 1056     Visit Number 2    Number of Visits 17    Date for PT Re-Evaluation 01/11/22    Authorization Type BCBS    PT Start Time 1100    PT Stop Time 1138    PT Time Calculation (min) 38 min    Activity Tolerance Patient tolerated treatment well;Patient limited by pain    Behavior During Therapy [facility] for tasks assessed/performed             History reviewed. No pertinent past medical history. Past Surgical History:  Procedure Laterality Date   HERNIA REPAIR     There are no problems to display for this patient.   REFERRING DIAG: M25.512 (ICD-10-CM) - Left shoulder pain, unspecified chronicity   Left RC partial tear. 3-4 weeks conservative management   THERAPY DIAG:  Chronic left shoulder pain  Muscle weakness (generalized)  Stiffness of left shoulder, not elsewhere classified  PERTINENT HISTORY: Post-concussion  PRECAUTIONS: N/A  SUBJECTIVE:  Pt states that cracking "is not as strong." He some mild pain with HEP.    PERTINENT HISTORY: Post-concussion   PAIN:  Are you having pain? No NPRS scale: 3/10, Worst 8/10 Pain location: L anterior shoulder and posterior Pain orientation: Left  PAIN TYPE: sharp, hot Aggravating factors: lifting heavy, going OH Relieving factors: ice, meds, using R arm    OBJECTIVE:   DIAGNOSTIC FINDINGS:  MRI L shoulder IMPRESSION: 1. Two focal near full-thickness rotator cuff tears involving the mid supraspinatus tendon and supraspinatus-infraspinatus interdigitation. 2. Mild intra-articular biceps tendinosis without tear. 3. Findings suspicious for a posterosuperior labral tear. 4. Mild subacromial-subdeltoid bursitis.   PATIENT SURVEYS:  FOTO 43 D/C 68  Flexion to 165 without  pain at end of session, ER to 50 deg at side without pain  TODAY'S TREATMENT:  10/27  Joint mob: L GHJ inf and post mob grade II-III STM: L infra, UT, supra   Rowing RTB 2x10 Seated external rotation with dowel 5s 10x Seated Scapular Retraction - 2 x daily - 7 x weekly - 2 sets - 10 reps Supine Shoulder Flexion AAROM - 2 x daily - 7 x weekly - 2 sets - 10 reps - 5 hold Isometric Shoulder External Rotation - 2 x daily - 7 x weekly - 1 sets - 10 reps - 5 hold      Eval Exercises Seated Scapular Retraction - 2 x daily - 7 x weekly - 2 sets - 10 reps Supine Shoulder Flexion AAROM - 2 x daily - 7 x weekly - 2 sets - 10 reps - 5 hold Isometric Shoulder External Rotation - 2 x daily - 7 x weekly - 1 sets - 10 reps - 5 hold   Patient Education Rotator Cuff Tear     PATIENT EDUCATION: Education details: MOI, diagnosis, prognosis, anatomy, exercise progression, joint protection, thermotherapy, DOMS expectations, muscle firing,  envelope of function, HEP, POC Person educated: Patient Education method: Explanation, Demonstration, Tactile cues, Verbal cues, and Handouts Education comprehension: verbalized understanding, returned demonstration, verbal cues required, and tactile cues required     HOME EXERCISE PROGRAM: Access Code: [email or web address] URL: [email or web address] Date: 10/13/2021 Prepared by: Zebedee Iba       ASSESSMENT:   CLINICAL IMPRESSION: Pt presents with continued L GHJ  stiffness and pain at today's session. Pt responded well to manual and exercise with report of decreased and pain by end of session when reaching OH. Pt HEP updated to include progressed ER ROM and scapular retraction exercise. Plan to continue with ER ROM and OH ROM next session.  Objective impairments include decreased knowledge of condition, decreased ROM, decreased strength, hypomobility, increased muscle spasms, impaired flexibility, impaired UE functional use, improper body mechanics,  postural dysfunction, and pain. These impairments are limiting patient from cleaning, community activity, driving, occupation, yard work, and shopping. Personal factors including Behavior pattern, Past/current experiences, Profession, and 1 comorbidity:    are also affecting patient's functional outcome. Patient will benefit from skilled PT to address above impairments and improve overall function.   REHAB POTENTIAL: Fair     CLINICAL DECISION MAKING: Stable/uncomplicated   EVALUATION COMPLEXITY: Low     GOALS:     SHORT TERM GOALS:   STG Name Target Date Goal status  1 Pt will become independent with HEP in order to demonstrate synthesis of PT education.   10/27/2021 INITIAL  2 Pt will be able to demonstrate full OH reaching ROM in order to demonstrate functional improvement in UE function for self-care and house hold duties.    11/10/2021 INITIAL  3 Pt will report at least 2 pt reduction on NPRS scale for pain in order to demonstrate functional improvement with household activity, self care, and ADL.     11/10/2021 INITIAL    LONG TERM GOALS:    LTG Name Target Date Goal status  1 Pt  will become independent with final HEP in order to demonstrate synthesis of PT education.   11/24/2021 INITIAL  2 Pt will be able to demonstrate within 75-80% strength with dynamometer MMT of R UE in order to demonstrate functional improvement in L shoulder strength and function.     12/08/2021 INITIAL  3 Pt will be able to reach [facility] and carry/hold >5 lbs in order to demonstrate functional improvement in L UE strength for return to PLOF.    12/08/2021 INITIAL  4 Pt will score >/= 68 on FOTO to demonstrate functional improvement in L shoulder function.     12/08/2021 INITIAL    PLAN: PT FREQUENCY: 1-2x/week   PT DURATION: 8 weeks   PLANNED INTERVENTIONS: Therapeutic exercises, Therapeutic activity, Neuro Muscular re-education, Balance training, Gait training, Patient/Family education, Joint  mobilization, Vestibular training, Aquatic Therapy, Dry Needling, Electrical stimulation, Spinal mobilization, Cryotherapy, Moist heat, scar mobilization, Taping, Vasopneumatic device, Traction, Ultrasound, Ionotophoresis 4mg /ml Dexamethasone, and Manual therapy   PLAN FOR NEXT SESSION: review HEP, STM/joint mobs, AAROM, isometrics in more directions, table flexion and ER     10/15/2021, 11:39 AM
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
## 2021-10-21 ENCOUNTER — Encounter: Payer: Self-pay | Admitting: Neurology

## 2021-10-21 ENCOUNTER — Ambulatory Visit (INDEPENDENT_AMBULATORY_CARE_PROVIDER_SITE_OTHER): Payer: Federal, State, Local not specified - PPO | Admitting: Neurology

## 2021-10-21 ENCOUNTER — Other Ambulatory Visit: Payer: Self-pay

## 2021-10-21 VITALS — BP 109/78 | HR 72 | Ht 76.0 in | Wt 193.4 lb

## 2021-10-21 DIAGNOSIS — H8110 Benign paroxysmal vertigo, unspecified ear: Secondary | ICD-10-CM

## 2021-10-21 DIAGNOSIS — G44329 Chronic post-traumatic headache, not intractable: Secondary | ICD-10-CM

## 2021-10-21 MED ORDER — AMITRIPTYLINE HCL 25 MG PO TABS: ORAL_TABLET | ORAL | 0 refills | Status: DC

## 2021-10-21 NOTE — Patient Instructions (Signed)
Start amitriptyline 25mg  tablet - take 1 tablet at bedtime for one week, then increase to 2 tablets at bedtime.  If no improvement in headaches in 8 weeks, contact me. Refer to physical therapy for vestibular rehab Limit use of pain relievers to no more than 2 days out of week to prevent risk of rebound or medication-overuse headache. Follow up 6 months.

## 2021-10-26 ENCOUNTER — Ambulatory Visit (HOSPITAL_BASED_OUTPATIENT_CLINIC_OR_DEPARTMENT_OTHER): Payer: Federal, State, Local not specified - PPO | Admitting: Physical Therapy

## 2021-10-26 ENCOUNTER — Other Ambulatory Visit: Payer: Self-pay

## 2021-10-26 ENCOUNTER — Encounter (HOSPITAL_BASED_OUTPATIENT_CLINIC_OR_DEPARTMENT_OTHER): Payer: Self-pay | Admitting: Physical Therapy

## 2021-10-26 DIAGNOSIS — M25512 Pain in left shoulder: Secondary | ICD-10-CM | POA: Insufficient documentation

## 2021-10-26 DIAGNOSIS — G8929 Other chronic pain: Secondary | ICD-10-CM | POA: Insufficient documentation

## 2021-10-26 DIAGNOSIS — H8111 Benign paroxysmal vertigo, right ear: Secondary | ICD-10-CM | POA: Diagnosis present

## 2021-10-26 DIAGNOSIS — H8110 Benign paroxysmal vertigo, unspecified ear: Secondary | ICD-10-CM | POA: Diagnosis not present

## 2021-10-26 DIAGNOSIS — R42 Dizziness and giddiness: Secondary | ICD-10-CM | POA: Diagnosis not present

## 2021-10-26 DIAGNOSIS — M6281 Muscle weakness (generalized): Secondary | ICD-10-CM | POA: Insufficient documentation

## 2021-10-26 DIAGNOSIS — M25612 Stiffness of left shoulder, not elsewhere classified: Secondary | ICD-10-CM | POA: Insufficient documentation

## 2021-10-26 NOTE — Therapy (Signed)
OUTPATIENT PHYSICAL THERAPY TREATMENT NOTE   Patient Name: Sergio Hernandez MRN: [id number] DOB:[birth year], 45 y.o., male Today's Date: 10/26/2021  PCP: Shade Flood, MD REFERRING PROVIDER: Shade Flood, MD   PT End of Session - 10/26/21 1103     Visit Number 3    Number of Visits 17    Date for PT Re-Evaluation 01/11/22    Authorization Type BCBS    PT Start Time 1101    PT Stop Time 1140    PT Time Calculation (min) 39 min    Activity Tolerance Patient tolerated treatment well;Patient limited by pain    Behavior During Therapy [facility] for tasks assessed/performed              History reviewed. No pertinent past medical history. Past Surgical History:  Procedure Laterality Date   HERNIA REPAIR     There are no problems to display for this patient.   REFERRING DIAG: M25.512 (ICD-10-CM) - Left shoulder pain, unspecified chronicity   Left RC partial tear. 3-4 weeks conservative management   THERAPY DIAG:  Chronic left shoulder pain  Muscle weakness (generalized)  Stiffness of left shoulder, not elsewhere classified  PERTINENT HISTORY: Post-concussion  PRECAUTIONS: N/A  SUBJECTIVE:  Pt states the exercises are helping. He feels most of the cracking is gone.    PERTINENT HISTORY: Post-concussion   PAIN:  Are you having pain? No NPRS scale: 3/10, Worst 8/10 Pain location: L anterior shoulder and posterior Pain orientation: Left  PAIN TYPE: sharp, hot Aggravating factors: lifting heavy, going OH Relieving factors: ice, meds, using R arm    OBJECTIVE:   DIAGNOSTIC FINDINGS:  MRI L shoulder IMPRESSION: 1. Two focal near full-thickness rotator cuff tears involving the mid supraspinatus tendon and supraspinatus-infraspinatus interdigitation. 2. Mild intra-articular biceps tendinosis without tear. 3. Findings suspicious for a posterosuperior labral tear. 4. Mild subacromial-subdeltoid bursitis.   PATIENT SURVEYS:  FOTO 43 D/C 68  Flexion to 147,  ABD to 132 without pain at end of session, ER to 75 deg at side without pain  TODAY'S TREATMENT:  11/7  Joint mob: L GHJ inf and post mob grade II-III; shoulder clocks grade 4 cardinal directions   S/L ER 2x10 1lb Seated external rotation with dowel 5s 10x Seated pulleys- flexion, Abd and scaption 3 min each Supine Shoulder Flexion AAROM - 10x 10x (140 deg at end) Isometric Shoulder External Rotation and ABD - 10x 5s  10/27  Joint mob: L GHJ inf and post mob grade II-III STM: L infra, UT, supra   Rowing RTB 2x10 Seated external rotation with dowel 5s 10x Seated Scapular Retraction - 2 x daily - 7 x weekly - 2 sets - 10 reps Supine Shoulder Flexion AAROM - 2 x daily - 7 x weekly - 2 sets - 10 reps - 5 hold Isometric Shoulder External Rotation - 2 x daily - 7 x weekly - 1 sets - 10 reps - 5 hold      Eval Exercises Seated Scapular Retraction - 2 x daily - 7 x weekly - 2 sets - 10 reps Supine Shoulder Flexion AAROM - 2 x daily - 7 x weekly - 2 sets - 10 reps - 5 hold Isometric Shoulder External Rotation - 2 x daily - 7 x weekly - 1 sets - 10 reps - 5 hold   Patient Education Rotator Cuff Tear     PATIENT EDUCATION: Education details: anatomy, exercise progression, joint protection, thermotherapy, DOMS expectations, muscle firing,  envelope of function,  HEP, POC Person educated: Patient Education method: Explanation, Demonstration, Tactile cues, Verbal cues, and Handouts Education comprehension: verbalized understanding, returned demonstration, verbal cues required, and tactile cues required     HOME EXERCISE PROGRAM: Access Code: [email or web address] URL: [email or web address] Date: 10/13/2021 Prepared by: Zebedee Iba       ASSESSMENT:   CLINICAL IMPRESSION: Pt presents with improved OH ROM at today's session but continues to have L GHJ stiffness, especially inferiorly. Pt able to reach increased flexion and ABD as compared to previous session with significantly  decreased pain and crepitus. Pt still most limited in ABD and scaption at this time due to pain. Pt was able to perform light resistance to ER without pain Pt to see MD tomorrow morning to decide further medical management. Pt inquires about PRP injections, basic information given. Instructed to ask MD regarding outcomes and procedural information. Pt would benefit from continued skilled therapy in order to reach goals and maximize functional L UE strength and ROM for prevention of further functional decline.    Objective impairments include decreased knowledge of condition, decreased ROM, decreased strength, hypomobility, increased muscle spasms, impaired flexibility, impaired UE functional use, improper body mechanics, postural dysfunction, and pain. These impairments are limiting patient from cleaning, community activity, driving, occupation, yard work, and shopping. Personal factors including Behavior pattern, Past/current experiences, Profession, and 1 comorbidity:    are also affecting patient's functional outcome. Patient will benefit from skilled PT to address above impairments and improve overall function.   REHAB POTENTIAL: Fair     CLINICAL DECISION MAKING: Stable/uncomplicated   EVALUATION COMPLEXITY: Low     GOALS:     SHORT TERM GOALS:   STG Name Target Date Goal status  1 Pt will become independent with HEP in order to demonstrate synthesis of PT education.   10/27/2021 INITIAL  2 Pt will be able to demonstrate full OH reaching ROM in order to demonstrate functional improvement in UE function for self-care and house hold duties.    11/10/2021 INITIAL  3 Pt will report at least 2 pt reduction on NPRS scale for pain in order to demonstrate functional improvement with household activity, self care, and ADL.     11/10/2021 INITIAL    LONG TERM GOALS:    LTG Name Target Date Goal status  1 Pt  will become independent with final HEP in order to demonstrate synthesis of PT  education.   11/24/2021 INITIAL  2 Pt will be able to demonstrate within 75-80% strength with dynamometer MMT of R UE in order to demonstrate functional improvement in L shoulder strength and function.     12/08/2021 INITIAL  3 Pt will be able to reach [facility] and carry/hold >5 lbs in order to demonstrate functional improvement in L UE strength for return to PLOF.    12/08/2021 INITIAL  4 Pt will score >/= 68 on FOTO to demonstrate functional improvement in L shoulder function.     12/08/2021 INITIAL    PLAN: PT FREQUENCY: 1-2x/week   PT DURATION: 8 weeks   PLANNED INTERVENTIONS: Therapeutic exercises, Therapeutic activity, Neuro Muscular re-education, Balance training, Gait training, Patient/Family education, Joint mobilization, Vestibular training, Aquatic Therapy, Dry Needling, Electrical stimulation, Spinal mobilization, Cryotherapy, Moist heat, scar mobilization, Taping, Vasopneumatic device, Traction, Ultrasound, Ionotophoresis 4mg /ml Dexamethasone, and Manual therapy   PLAN FOR NEXT SESSION: review HEP, STM/joint mobs,isometrics in more directions, table flexion, progress rowing  PT, DPT 10/26/21 11:44 AM

## 2021-10-27 ENCOUNTER — Ambulatory Visit (INDEPENDENT_AMBULATORY_CARE_PROVIDER_SITE_OTHER): Payer: Federal, State, Local not specified - PPO | Admitting: Orthopaedic Surgery

## 2021-10-27 DIAGNOSIS — S46011A Strain of muscle(s) and tendon(s) of the rotator cuff of right shoulder, initial encounter: Secondary | ICD-10-CM
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
## 2021-10-28 ENCOUNTER — Encounter (HOSPITAL_BASED_OUTPATIENT_CLINIC_OR_DEPARTMENT_OTHER): Payer: Self-pay | Admitting: Physical Therapy

## 2021-10-28 ENCOUNTER — Ambulatory Visit (HOSPITAL_BASED_OUTPATIENT_CLINIC_OR_DEPARTMENT_OTHER): Payer: Federal, State, Local not specified - PPO | Admitting: Physical Therapy

## 2021-10-28 ENCOUNTER — Other Ambulatory Visit: Payer: Self-pay

## 2021-10-28 DIAGNOSIS — M25512 Pain in left shoulder: Secondary | ICD-10-CM

## 2021-10-28 DIAGNOSIS — M6281 Muscle weakness (generalized): Secondary | ICD-10-CM

## 2021-10-28 DIAGNOSIS — M25612 Stiffness of left shoulder, not elsewhere classified: Secondary | ICD-10-CM

## 2021-10-28 DIAGNOSIS — G8929 Other chronic pain: Secondary | ICD-10-CM

## 2021-10-28 DIAGNOSIS — R42 Dizziness and giddiness: Secondary | ICD-10-CM | POA: Diagnosis not present

## 2021-10-28 NOTE — Therapy (Signed)
OUTPATIENT PHYSICAL THERAPY TREATMENT NOTE   Patient Name: Sergio Hernandez MRN: [id number] DOB:[birth year], 45 y.o., male Today's Date: 10/28/2021  PCP: Shade Flood, MD REFERRING PROVIDER: Shade Flood, MD   PT End of Session - 10/28/21 1016     Visit Number 4    Number of Visits 17    Date for PT Re-Evaluation 01/11/22    Authorization Type BCBS    PT Start Time 1016    PT Stop Time 1055    PT Time Calculation (min) 39 min    Activity Tolerance Patient tolerated treatment well;Patient limited by pain    Behavior During Therapy [facility] for tasks assessed/performed              History reviewed. No pertinent past medical history. Past Surgical History:  Procedure Laterality Date   HERNIA REPAIR     There are no problems to display for this patient.   REFERRING DIAG: M25.512 (ICD-10-CM) - Left shoulder pain, unspecified chronicity   Left RC partial tear. 3-4 weeks conservative management   THERAPY DIAG:  Chronic left shoulder pain  Muscle weakness (generalized)  Stiffness of left shoulder, not elsewhere classified  PERTINENT HISTORY: Post-concussion  PRECAUTIONS: N/A  SUBJECTIVE:  Pt states the shoulder is doing well. He is reaching better. He does have ant shoulder pain after demonstrating ROM with MD yesterday.     PERTINENT HISTORY: Post-concussion   PAIN:  Are you having pain?Yes NPRS scale 4/10 Pain location: L anterior shoulder and posterior Pain orientation: Left  PAIN TYPE: sharp, hot Aggravating factors: lifting heavy, going OH Relieving factors: ice, meds, using R arm    OBJECTIVE:   DIAGNOSTIC FINDINGS:  MRI L shoulder IMPRESSION: 1. Two focal near full-thickness rotator cuff tears involving the mid supraspinatus tendon and supraspinatus-infraspinatus interdigitation. 2. Mild intra-articular biceps tendinosis without tear. 3. Findings suspicious for a posterosuperior labral tear. 4. Mild subacromial-subdeltoid bursitis.    PATIENT SURVEYS:  FOTO 43 D/C 68  Flexion to 165, ABD to 150 without pain at end of session, ER to 75 deg at side without pain  TODAY'S TREATMENT:  11/9  Joint mob: L GHJ inf and post mob grade III; shoulder clocks grade 4 cardinal directions STM: ant deltoid and biceps    Supine protraction 3lb 2x10 S/L ER 3x10 2lb Seated external rotation with dowel 5s 10x Wall push- up 2x10 UBE retro and fwd 3 min each   11/7  Joint mob: L GHJ inf and post mob grade II-III; shoulder clocks grade 4 cardinal directions   S/L ER 2x10 1lb Seated external rotation with dowel 5s 10x Seated pulleys- flexion, Abd and scaption 3 min each Supine Shoulder Flexion AAROM - 10x 10x (140 deg at end) Isometric Shoulder External Rotation and ABD - 10x 5s  _________________________________________   PATIENT EDUCATION: Education details: anatomy, exercise progression, joint protection, thermotherapy, DOMS expectations, muscle firing,  envelope of function, HEP, POC Person educated: Patient Education method: Explanation, Demonstration, Tactile cues, Verbal cues, and Handouts Education comprehension: verbalized understanding, returned demonstration, verbal cues required, and tactile cues required     HOME EXERCISE PROGRAM: Access Code: [email or web address] URL: [email or web address] Date: 10/13/2021 Prepared by: Zebedee Iba       ASSESSMENT:   CLINICAL IMPRESSION: Pt able to continue with progression of  L shoulder ROM and strength without increased pain. Pt did presents with increased L anterior deltoid and biceps hypertonicity. Pt reports decreased shoulder pain and joint stiffnes by end of session after joint mobilizations and STM.  Pt pain focused mainly anteriorly, especially once fatigue sets in Pt likely to continue with ROM and repeat strengthening exercise. Pt would benefit from continued skilled therapy in order to reach goals and maximize functional L UE strength and ROM for prevention of  further functional decline.    Objective impairments include decreased knowledge of condition, decreased ROM, decreased strength, hypomobility, increased muscle spasms, impaired flexibility, impaired UE functional use, improper body mechanics, postural dysfunction, and pain. These impairments are limiting patient from cleaning, community activity, driving, occupation, yard work, and shopping. Personal factors including Behavior pattern, Past/current experiences, Profession, and 1 comorbidity:    are also affecting patient's functional outcome. Patient will benefit from skilled PT to address above impairments and improve overall function.   REHAB POTENTIAL: Fair     CLINICAL DECISION MAKING: Stable/uncomplicated   EVALUATION COMPLEXITY: Low     GOALS:     SHORT TERM GOALS:   STG Name Target Date Goal status  1 Pt will become independent with HEP in order to demonstrate synthesis of PT education.   10/27/2021 INITIAL  2 Pt will be able to demonstrate full OH reaching ROM in order to demonstrate functional improvement in UE function for self-care and house hold duties.    11/10/2021 INITIAL  3 Pt will report at least 2 pt reduction on NPRS scale for pain in order to demonstrate functional improvement with household activity, self care, and ADL.     11/10/2021 INITIAL    LONG TERM GOALS:    LTG Name Target Date Goal status  1 Pt  will become independent with final HEP in order to demonstrate synthesis of PT education.   11/24/2021 INITIAL  2 Pt will be able to demonstrate within 75-80% strength with dynamometer MMT of R UE in order to demonstrate functional improvement in L shoulder strength and function.     12/08/2021 INITIAL  3 Pt will be able to reach [facility] and carry/hold >5 lbs in order to demonstrate functional improvement in L UE strength for return to PLOF.    12/08/2021 INITIAL  4 Pt will score >/= 68 on FOTO to demonstrate functional improvement in L shoulder function.      12/08/2021 INITIAL    PLAN: PT FREQUENCY: 1-2x/week   PT DURATION: 8 weeks   PLANNED INTERVENTIONS: Therapeutic exercises, Therapeutic activity, Neuro Muscular re-education, Balance training, Gait training, Patient/Family education, Joint mobilization, Vestibular training, Aquatic Therapy, Dry Needling, Electrical stimulation, Spinal mobilization, Cryotherapy, Moist heat, scar mobilization, Taping, Vasopneumatic device, Traction, Ultrasound, Ionotophoresis 4mg /ml Dexamethasone, and Manual therapy   PLAN FOR NEXT SESSION: review HEP, STM/joint mobs,isometrics in more directions, table flexion, progress rowing  PT, DPT 10/28/21 10:56 AM

## 2021-11-04 ENCOUNTER — Encounter (HOSPITAL_BASED_OUTPATIENT_CLINIC_OR_DEPARTMENT_OTHER): Payer: Self-pay | Admitting: Physical Therapy

## 2021-11-04 ENCOUNTER — Ambulatory Visit (HOSPITAL_BASED_OUTPATIENT_CLINIC_OR_DEPARTMENT_OTHER): Payer: Federal, State, Local not specified - PPO | Admitting: Physical Therapy

## 2021-11-04 ENCOUNTER — Other Ambulatory Visit: Payer: Self-pay

## 2021-11-04 DIAGNOSIS — M25512 Pain in left shoulder: Secondary | ICD-10-CM

## 2021-11-04 DIAGNOSIS — M6281 Muscle weakness (generalized): Secondary | ICD-10-CM

## 2021-11-04 DIAGNOSIS — R42 Dizziness and giddiness: Secondary | ICD-10-CM | POA: Diagnosis not present

## 2021-11-04 DIAGNOSIS — G8929 Other chronic pain: Secondary | ICD-10-CM

## 2021-11-04 DIAGNOSIS — M25612 Stiffness of left shoulder, not elsewhere classified: Secondary | ICD-10-CM

## 2021-11-04 NOTE — Therapy (Signed)
OUTPATIENT PHYSICAL THERAPY TREATMENT NOTE   Patient Name: Sergio Hernandez MRN: [id number] DOB:1976/09/02, 45 y.o., male Today's Date: 11/04/2021  PCP: Shade Flood, MD REFERRING PROVIDER: Shade Flood, MD   PT End of Session - 11/04/21 1012     Visit Number 5    Number of Visits 17    Date for PT Re-Evaluation 01/11/22    Authorization Type BCBS    PT Start Time 1015    PT Stop Time 1055    PT Time Calculation (min) 40 min    Activity Tolerance Patient tolerated treatment well;Patient limited by pain    Behavior During Therapy [facility] for tasks assessed/performed              History reviewed. No pertinent past medical history. Past Surgical History:  Procedure Laterality Date   HERNIA REPAIR     There are no problems to display for this patient.   REFERRING DIAG: M25.512 (ICD-10-CM) - Left shoulder pain, unspecified chronicity   Left RC partial tear. 3-4 weeks conservative management   THERAPY DIAG:  Muscle weakness (generalized)  Chronic left shoulder pain  Stiffness of left shoulder, not elsewhere classified  PERTINENT HISTORY: Post-concussion  PRECAUTIONS: N/A  SUBJECTIVE:  Pt states he did some work on his house but slipped and caught himself on the the L shoulder. He fell directly on the L arm. He has had some increased pain since. Pt denies feeling pop or crack.     PERTINENT HISTORY: Post-concussion   PAIN:  Are you having pain?Yes NPRS scale 4/10 Pain location: L anterior shoulder and posterior Pain orientation: Left  PAIN TYPE: sharp, hot Aggravating factors: lifting heavy, going OH Relieving factors: ice, meds, using R arm    OBJECTIVE:   DIAGNOSTIC FINDINGS:  MRI L shoulder IMPRESSION: 1. Two focal near full-thickness rotator cuff tears involving the mid supraspinatus tendon and supraspinatus-infraspinatus interdigitation. 2. Mild intra-articular biceps tendinosis without tear. 3. Findings suspicious for a posterosuperior  labral tear. 4. Mild subacromial-subdeltoid bursitis.   PATIENT SURVEYS:  FOTO 43 D/C 68  (-) belly press (+) painful arc (-) ER resist  No pain with flexion or ABD resistance   TODAY'S TREATMENT: 11/16  Joint mob: L GHJ inf and post mob grade III; shoulder clocks grade 4 cardinal directions     Supine protraction 3lb 3x10 S/L ER 3x10 2lb Wall push- up 2x10 YTB bilat ER 2x10 YTB diagonals 2x4 each direction. Rowing Blue TB 2x10  11/9  Joint mob: L GHJ inf and post mob grade III; shoulder clocks grade 4 cardinal directions STM: ant deltoid and biceps    Supine protraction 3lb 2x10 S/L ER 3x10 2lb Seated external rotation with dowel 5s 10x Wall push- up 2x10 UBE retro and fwd 3 min each   11/7  Joint mob: L GHJ inf and post mob grade II-III; shoulder clocks grade 4 cardinal directions   S/L ER 2x10 1lb Seated external rotation with dowel 5s 10x Seated pulleys- flexion, Abd and scaption 3 min each Supine Shoulder Flexion AAROM - 10x 10x (140 deg at end) Isometric Shoulder External Rotation and ABD - 10x 5s  _________________________________________   PATIENT EDUCATION: Education details: anatomy, exercise progression, joint protection, thermotherapy, DOMS expectations, muscle firing,  envelope of function, HEP, POC Person educated: Patient Education method: Explanation, Demonstration, Tactile cues, Verbal cues, and Handouts Education comprehension: verbalized understanding, returned demonstration, verbal cues required, and tactile cues required     HOME EXERCISE PROGRAM: Access Code: [email or web address] URL: [email or web address]  Date: 10/13/2021 Prepared by: Zebedee Iba       ASSESSMENT:   CLINICAL IMPRESSION: Clinical testing today does not suggest  worsening of internal derangement of shoulder following report of fall onto shoulder. Tenderness appears related to soft tissue trauma and potential acromial bone bruise vs worsening of tear. Pt able to  continue with physical therapy exercise today without exacerbation of pain. Plan to continue with strengthening at next session. Pt would benefit from continued skilled therapy in order to reach goals and maximize functional L UE strength and ROM for prevention of further functional decline.    Objective impairments include decreased knowledge of condition, decreased ROM, decreased strength, hypomobility, increased muscle spasms, impaired flexibility, impaired UE functional use, improper body mechanics, postural dysfunction, and pain. These impairments are limiting patient from cleaning, community activity, driving, occupation, yard work, and shopping. Personal factors including Behavior pattern, Past/current experiences, Profession, and 1 comorbidity:    are also affecting patient's functional outcome. Patient will benefit from skilled PT to address above impairments and improve overall function.   REHAB POTENTIAL: Fair     CLINICAL DECISION MAKING: Stable/uncomplicated   EVALUATION COMPLEXITY: Low     GOALS:     SHORT TERM GOALS:   STG Name Target Date Goal status  1 Pt will become independent with HEP in order to demonstrate synthesis of PT education.   10/27/2021 INITIAL  2 Pt will be able to demonstrate full OH reaching ROM in order to demonstrate functional improvement in UE function for self-care and house hold duties.    11/10/2021 INITIAL  3 Pt will report at least 2 pt reduction on NPRS scale for pain in order to demonstrate functional improvement with household activity, self care, and ADL.     11/10/2021 INITIAL    LONG TERM GOALS:    LTG Name Target Date Goal status  1 Pt  will become independent with final HEP in order to demonstrate synthesis of PT education.   11/24/2021 INITIAL  2 Pt will be able to demonstrate within 75-80% strength with dynamometer MMT of R UE in order to demonstrate functional improvement in L shoulder strength and function.     12/08/2021 INITIAL   3 Pt will be able to reach [facility] and carry/hold >5 lbs in order to demonstrate functional improvement in L UE strength for return to PLOF.    12/08/2021 INITIAL  4 Pt will score >/= 68 on FOTO to demonstrate functional improvement in L shoulder function.     12/08/2021 INITIAL    PLAN: PT FREQUENCY: 1-2x/week   PT DURATION: 8 weeks   PLANNED INTERVENTIONS: Therapeutic exercises, Therapeutic activity, Neuro Muscular re-education, Balance training, Gait training, Patient/Family education, Joint mobilization, Vestibular training, Aquatic Therapy, Dry Needling, Electrical stimulation, Spinal mobilization, Cryotherapy, Moist heat, scar mobilization, Taping, Vasopneumatic device, Traction, Ultrasound, Ionotophoresis 4mg /ml Dexamethasone, and Manual therapy   PLAN FOR NEXT SESSION: review HEP, STM/joint mobs, rhythmic stabilization, UBE warm up, prone I, Y, T  Rheannon Cerney PT, DPT 11/04/21 10:55 AM

## 2021-11-06 ENCOUNTER — Ambulatory Visit (HOSPITAL_BASED_OUTPATIENT_CLINIC_OR_DEPARTMENT_OTHER): Payer: Federal, State, Local not specified - PPO | Admitting: Physical Therapy

## 2021-11-06 ENCOUNTER — Encounter (HOSPITAL_BASED_OUTPATIENT_CLINIC_OR_DEPARTMENT_OTHER): Payer: Self-pay | Admitting: Physical Therapy

## 2021-11-06 ENCOUNTER — Other Ambulatory Visit: Payer: Self-pay

## 2021-11-06 ENCOUNTER — Ambulatory Visit
Payer: Federal, State, Local not specified - PPO | Attending: Neurology | Admitting: Rehabilitative and Restorative Service Providers"

## 2021-11-06 ENCOUNTER — Encounter: Payer: Self-pay | Admitting: Rehabilitative and Restorative Service Providers"

## 2021-11-06 DIAGNOSIS — H8111 Benign paroxysmal vertigo, right ear: Secondary | ICD-10-CM | POA: Insufficient documentation

## 2021-11-06 DIAGNOSIS — G8929 Other chronic pain: Secondary | ICD-10-CM

## 2021-11-06 DIAGNOSIS — M25512 Pain in left shoulder: Secondary | ICD-10-CM

## 2021-11-06 DIAGNOSIS — M6281 Muscle weakness (generalized): Secondary | ICD-10-CM

## 2021-11-06 DIAGNOSIS — M25612 Stiffness of left shoulder, not elsewhere classified: Secondary | ICD-10-CM

## 2021-11-06 DIAGNOSIS — R42 Dizziness and giddiness: Secondary | ICD-10-CM | POA: Diagnosis not present

## 2021-11-06 DIAGNOSIS — H8110 Benign paroxysmal vertigo, unspecified ear: Secondary | ICD-10-CM | POA: Insufficient documentation
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
## 2021-11-06 NOTE — Therapy (Signed)
OUTPATIENT PHYSICAL THERAPY TREATMENT NOTE   Patient Name: Sergio Hernandez MRN: [id number] DOB:1976/06/11, 45 y.o., male Today's Date: 11/06/2021  PCP: Shade Flood, MD REFERRING PROVIDER: Shade Flood, MD   PT End of Session - 11/06/21 1012     Visit Number 6    Number of Visits 17    Date for PT Re-Evaluation 01/11/22    Authorization Type BCBS    PT Start Time 1015    PT Stop Time 1100    PT Time Calculation (min) 45 min    Activity Tolerance Patient tolerated treatment well;Patient limited by pain    Behavior During Therapy [facility] for tasks assessed/performed              History reviewed. No pertinent past medical history. Past Surgical History:  Procedure Laterality Date   HERNIA REPAIR     There are no problems to display for this patient.   REFERRING DIAG: M25.512 (ICD-10-CM) - Left shoulder pain, unspecified chronicity   Left RC partial tear. 3-4 weeks conservative management   THERAPY DIAG:  Muscle weakness (generalized)  Chronic left shoulder pain  Stiffness of left shoulder, not elsewhere classified  PERTINENT HISTORY: Post-concussion  PRECAUTIONS: N/A  SUBJECTIVE:  Pt states the shoulder feels stronger. He was sore but it has gone away. He has seen a vestibular specialist and feels much better.    PERTINENT HISTORY: Post-concussion   PAIN:  Are you having pain? No NPRS scale 0/10 Pain location: L anterior shoulder and posterior Pain orientation: Left  PAIN TYPE: sharp, hot Aggravating factors: lifting heavy, going OH Relieving factors: ice, meds, using R arm    OBJECTIVE:   DIAGNOSTIC FINDINGS:  MRI L shoulder IMPRESSION: 1. Two focal near full-thickness rotator cuff tears involving the mid supraspinatus tendon and supraspinatus-infraspinatus interdigitation. 2. Mild intra-articular biceps tendinosis without tear. 3. Findings suspicious for a posterosuperior labral tear. 4. Mild subacromial-subdeltoid bursitis.   PATIENT  SURVEYS:  FOTO 43 D/C 68  (-) belly press (+) painful arc (-) ER resist  No pain with flexion or ABD resistance   TODAY'S TREATMENT: 11/18  Joint mob: L GHJ inf and post mob grade III; shoulder clocks grade 4 cardinal directions  UBE L1.5 3 min retro,fwd   Supine protraction 5lb 2x10 Prone I, Y, T 210each YTB bilat ER 2x10 YTB diagonals 2x4 each direction.   11/16  Joint mob: L GHJ inf and post mob grade III; shoulder clocks grade 4 cardinal directions     Supine protraction 3lb 3x10 S/L ER 3x10 2lb Wall push- up 2x10 YTB bilat ER 2x10 YTB diagonals 2x4 each direction. Rowing Blue TB 2x10   _________________________________________   PATIENT EDUCATION: Education details: anatomy, exercise progression, joint protection, thermotherapy, DOMS expectations, muscle firing,  envelope of function, HEP, POC Person educated: Patient Education method: Explanation, Demonstration, Tactile cues, Verbal cues, and Handouts Education comprehension: verbalized understanding, returned demonstration, verbal cues required, and tactile cues required     HOME EXERCISE PROGRAM: Access Code: [email or web address] URL: [email or web address] Date: 10/13/2021 Prepared by: Zebedee Iba       ASSESSMENT:   CLINICAL IMPRESSION: Pt able to continue with L UE strengthening without increased pain. Pt shows near symmetrical UE reaching ROM OH but done demonstrate periscapular weakness and tetany with prone gravity resisted exercise. HEP maintained at this time given recent update. Pt progressing well with therapy, especially with recent visit to vestibular specialist. Pt likely able to tolerate more dynamic motions during future exercise. Pt would benefit from  continued skilled therapy in order to reach goals and maximize functional L UE strength and ROM for prevention of further functional decline.    Objective impairments include decreased knowledge of condition, decreased ROM, decreased  strength, hypomobility, increased muscle spasms, impaired flexibility, impaired UE functional use, improper body mechanics, postural dysfunction, and pain. These impairments are limiting patient from cleaning, community activity, driving, occupation, yard work, and shopping. Personal factors including Behavior pattern, Past/current experiences, Profession, and 1 comorbidity:    are also affecting patient's functional outcome. Patient will benefit from skilled PT to address above impairments and improve overall function.   REHAB POTENTIAL: Fair     CLINICAL DECISION MAKING: Stable/uncomplicated   EVALUATION COMPLEXITY: Low     GOALS:     SHORT TERM GOALS:   STG Name Target Date Goal status  1 Pt will become independent with HEP in order to demonstrate synthesis of PT education.   10/27/2021 INITIAL  2 Pt will be able to demonstrate full OH reaching ROM in order to demonstrate functional improvement in UE function for self-care and house hold duties.    11/10/2021 INITIAL  3 Pt will report at least 2 pt reduction on NPRS scale for pain in order to demonstrate functional improvement with household activity, self care, and ADL.     11/10/2021 INITIAL    LONG TERM GOALS:    LTG Name Target Date Goal status  1 Pt  will become independent with final HEP in order to demonstrate synthesis of PT education.   11/24/2021 INITIAL  2 Pt will be able to demonstrate within 75-80% strength with dynamometer MMT of R UE in order to demonstrate functional improvement in L shoulder strength and function.     12/08/2021 INITIAL  3 Pt will be able to reach [facility] and carry/hold >5 lbs in order to demonstrate functional improvement in L UE strength for return to PLOF.    12/08/2021 INITIAL  4 Pt will score >/= 68 on FOTO to demonstrate functional improvement in L shoulder function.     12/08/2021 INITIAL    PLAN: PT FREQUENCY: 1-2x/week   PT DURATION: 8 weeks   PLANNED INTERVENTIONS: Therapeutic  exercises, Therapeutic activity, Neuro Muscular re-education, Balance training, Gait training, Patient/Family education, Joint mobilization, Vestibular training, Aquatic Therapy, Dry Needling, Electrical stimulation, Spinal mobilization, Cryotherapy, Moist heat, scar mobilization, Taping, Vasopneumatic device, Traction, Ultrasound, Ionotophoresis 4mg /ml Dexamethasone, and Manual therapy   PLAN FOR NEXT SESSION: review HEP, STM/joint mobs, rhythmic stabilization, review prone I, Y, T, manual PNF., progress HEP band to red  PT, DPT 11/06/21 12:26 PM

## 2021-11-06 NOTE — Patient Instructions (Signed)
Access Code: [email or web address] URL: [email or web address] Date: 11/06/2021 Prepared by: Reather Laurence  Exercises Brandt-Daroff Vestibular Exercise - 1-2 x daily - 7 x weekly - 1 sets - 5 reps

## 2021-11-09 ENCOUNTER — Encounter (HOSPITAL_BASED_OUTPATIENT_CLINIC_OR_DEPARTMENT_OTHER): Payer: Self-pay | Admitting: Physical Therapy

## 2021-11-09 ENCOUNTER — Other Ambulatory Visit: Payer: Self-pay

## 2021-11-09 ENCOUNTER — Ambulatory Visit (HOSPITAL_BASED_OUTPATIENT_CLINIC_OR_DEPARTMENT_OTHER): Payer: Federal, State, Local not specified - PPO | Admitting: Physical Therapy

## 2021-11-09 DIAGNOSIS — M6281 Muscle weakness (generalized): Secondary | ICD-10-CM

## 2021-11-09 DIAGNOSIS — R42 Dizziness and giddiness: Secondary | ICD-10-CM | POA: Diagnosis not present

## 2021-11-09 DIAGNOSIS — M25612 Stiffness of left shoulder, not elsewhere classified: Secondary | ICD-10-CM

## 2021-11-09 DIAGNOSIS — G8929 Other chronic pain: Secondary | ICD-10-CM

## 2021-11-09 DIAGNOSIS — M25512 Pain in left shoulder: Secondary | ICD-10-CM

## 2021-11-09 NOTE — Therapy (Signed)
OUTPATIENT PHYSICAL THERAPY TREATMENT NOTE   Patient Name: Sergio Hernandez MRN: [id number] DOB:1976/05/07, 45 y.o., male Today's Date: 11/09/2021  PCP: Wendie Agreste, MD REFERRING PROVIDER: Wendie Agreste, MD   PT End of Session - 11/09/21 1154     Visit Number 7    Number of Visits 17    Date for PT Re-Evaluation 01/11/22    Authorization Type BCBS    PT Start Time 1150    PT Stop Time 1230    PT Time Calculation (min) 40 min    Activity Tolerance Patient tolerated treatment well;Patient limited by pain    Behavior During Therapy [facility] for tasks assessed/performed              History reviewed. No pertinent past medical history. Past Surgical History:  Procedure Laterality Date   HERNIA REPAIR     There are no problems to display for this patient.   REFERRING DIAG: M25.512 (ICD-10-CM) - Left shoulder pain, unspecified chronicity   Left RC partial tear. 3-4 weeks conservative management   THERAPY DIAG:  Muscle weakness (generalized)  Chronic left shoulder pain  Stiffness of left shoulder, not elsewhere classified  PERTINENT HISTORY: Post-concussion  PRECAUTIONS: N/A  SUBJECTIVE:  Pt states the shoulder is continuing to get getter. He had a crack in the shoulder and felt relief after last night.     PERTINENT HISTORY: Post-concussion   PAIN:  Are you having pain? No NPRS scale 0/10 Pain location: L anterior shoulder and posterior Pain orientation: Left  PAIN TYPE: sharp, hot Aggravating factors: lifting heavy, going OH Relieving factors: ice, meds, using R arm    OBJECTIVE:   DIAGNOSTIC FINDINGS:  MRI L shoulder IMPRESSION: 1. Two focal near full-thickness rotator cuff tears involving the mid supraspinatus tendon and supraspinatus-infraspinatus interdigitation. 2. Mild intra-articular biceps tendinosis without tear. 3. Findings suspicious for a posterosuperior labral tear. 4. Mild subacromial-subdeltoid bursitis.   PATIENT SURVEYS:  FOTO  43 D/C 68  (-) belly press (+) painful arc (-) ER resist  No pain with flexion or ABD resistance   TODAY'S TREATMENT:  11/21  Joint mob: L GHJ inf and post mob grade III     Rhythmic stabilization 30s 4x Prone I, Y, T 3x10 each RTB bilat ER 3x10 RTB diagonals 2x4 each direction. S/L ER 4lbs 2x10  11/18  Joint mob: L GHJ inf and post mob grade III; shoulder clocks grade 4 cardinal directions  UBE L1.5 3 min retro,fwd   Supine protraction 5lb 2x10 Prone I, Y, T 210each YTB bilat ER 2x10 YTB diagonals 2x4 each direction.  _________________________________________   PATIENT EDUCATION: Education details: anatomy, exercise progression, joint protection, thermotherapy, DOMS expectations, muscle firing,  envelope of function, HEP, POC Person educated: Patient Education method: Explanation, Demonstration, Tactile cues, Verbal cues, and Handouts Education comprehension: verbalized understanding, returned demonstration, verbal cues required, and tactile cues required     HOME EXERCISE PROGRAM: Access Code: [email or web address] URL: [email or web address] Date: 10/13/2021 Prepared by: Daleen Bo       ASSESSMENT:   CLINICAL IMPRESSION: Pt demonstrates symmetrical OH reaching at today and able to increase intensity of exercise. Pt continues to demonstrate periscapular weakness and tetany with prone gravity resisted exercise during review. Pt with noted fatigue during ER in S/L and seated. HEP updated to increase resistance with exercise. Pt to decrease frequency of visits at this time due to improvement and independence with HEP. Pt would benefit from continued skilled therapy in order to reach goals and maximize  functional L UE strength and ROM for prevention of further functional decline.    Objective impairments include decreased knowledge of condition, decreased ROM, decreased strength, hypomobility, increased muscle spasms, impaired flexibility, impaired UE functional  use, improper body mechanics, postural dysfunction, and pain. These impairments are limiting patient from cleaning, community activity, driving, occupation, yard work, and shopping. Personal factors including Behavior pattern, Past/current experiences, Profession, and 1 comorbidity:    are also affecting patient's functional outcome. Patient will benefit from skilled PT to address above impairments and improve overall function.   REHAB POTENTIAL: Fair     CLINICAL DECISION MAKING: Stable/uncomplicated   EVALUATION COMPLEXITY: Low     GOALS:     SHORT TERM GOALS:   STG Name Target Date Goal status  1 Pt will become independent with HEP in order to demonstrate synthesis of PT education.   10/27/2021 MET  2 Pt will be able to demonstrate full OH reaching ROM in order to demonstrate functional improvement in UE function for self-care and house hold duties.    11/10/2021 MET  3 Pt will report at least 2 pt reduction on NPRS scale for pain in order to demonstrate functional improvement with household activity, self care, and ADL.     11/10/2021 MET    LONG TERM GOALS:    LTG Name Target Date Goal status  1 Pt  will become independent with final HEP in order to demonstrate synthesis of PT education.   11/24/2021 Ongoing  2 Pt will be able to demonstrate within 75-80% strength with dynamometer MMT of R UE in order to demonstrate functional improvement in L shoulder strength and function.     12/08/2021 Ongoing  3 Pt will be able to reach [facility] and carry/hold >5 lbs in order to demonstrate functional improvement in L UE strength for return to PLOF.    12/08/2021 Ongoing  4 Pt will score >/= 68 on FOTO to demonstrate functional improvement in L shoulder function.     12/08/2021 Ongoing    PLAN: PT FREQUENCY: 1-2x/week   PT DURATION: 8 weeks   PLANNED INTERVENTIONS: Therapeutic exercises, Therapeutic activity, Neuro Muscular re-education, Balance training, Gait training, Patient/Family  education, Joint mobilization, Vestibular training, Aquatic Therapy, Dry Needling, Electrical stimulation, Spinal mobilization, Cryotherapy, Moist heat, scar mobilization, Taping, Vasopneumatic device, Traction, Ultrasound, Ionotophoresis 4mg /ml Dexamethasone, and Manual therapy   PLAN FOR NEXT SESSION: review HEP, STM/joint mobs, repeat rhythmic stabilization, manual PNF   Daleen Bo PT, DPT 11/09/21 12:36 PM

## 2021-11-11 ENCOUNTER — Ambulatory Visit (HOSPITAL_BASED_OUTPATIENT_CLINIC_OR_DEPARTMENT_OTHER): Payer: Federal, State, Local not specified - PPO | Admitting: Physical Therapy

## 2021-11-18 ENCOUNTER — Encounter (HOSPITAL_BASED_OUTPATIENT_CLINIC_OR_DEPARTMENT_OTHER): Payer: Self-pay | Admitting: Physical Therapy

## 2021-11-18 ENCOUNTER — Ambulatory Visit (HOSPITAL_BASED_OUTPATIENT_CLINIC_OR_DEPARTMENT_OTHER): Payer: Federal, State, Local not specified - PPO | Admitting: Physical Therapy

## 2021-11-18 ENCOUNTER — Other Ambulatory Visit: Payer: Self-pay

## 2021-11-18 ENCOUNTER — Telehealth: Payer: Self-pay | Admitting: Rehabilitative and Restorative Service Providers"

## 2021-11-18 DIAGNOSIS — M25612 Stiffness of left shoulder, not elsewhere classified: Secondary | ICD-10-CM

## 2021-11-18 DIAGNOSIS — G8929 Other chronic pain: Secondary | ICD-10-CM

## 2021-11-18 DIAGNOSIS — R42 Dizziness and giddiness: Secondary | ICD-10-CM | POA: Diagnosis not present

## 2021-11-18 DIAGNOSIS — M6281 Muscle weakness (generalized): Secondary | ICD-10-CM

## 2021-11-18 DIAGNOSIS — M25512 Pain in left shoulder: Secondary | ICD-10-CM

## 2021-11-18 NOTE — Therapy (Signed)
OUTPATIENT PHYSICAL THERAPY TREATMENT NOTE   Patient Name: Sergio Hernandez MRN: [id number] DOB:[birth year], 45 y.o., male Today's Date: 11/18/2021  PCP: Wendie Agreste, MD REFERRING PROVIDER: Wendie Agreste, MD   PT End of Session - 11/18/21 1025     Visit Number 8    Number of Visits 17    Date for PT Re-Evaluation 01/11/22    Authorization Type BCBS    PT Start Time 1015    PT Stop Time 1055    PT Time Calculation (min) 40 min    Activity Tolerance Patient tolerated treatment well;Patient limited by pain    Behavior During Therapy [facility] for tasks assessed/performed               History reviewed. No pertinent past medical history. Past Surgical History:  Procedure Laterality Date   HERNIA REPAIR     There are no problems to display for this patient.   REFERRING DIAG: M25.512 (ICD-10-CM) - Left shoulder pain, unspecified chronicity   Left RC partial tear. 3-4 weeks conservative management   THERAPY DIAG:  Muscle weakness (generalized)  Chronic left shoulder pain  Stiffness of left shoulder, not elsewhere classified  PERTINENT HISTORY: Post-concussion  PRECAUTIONS: N/A  SUBJECTIVE:  Pt states the L shoulder is sometimes painful after stretching. Strength exercises do not bother it. Pt states he might be pushing too far or the weather is affecting his pain. He describes the pain as sharp in the shoulder and "all over" in the tricep.   PERTINENT HISTORY: Post-concussion   PAIN:  Are you having pain? No NPRS scale 0/10 Pain location: L anterior shoulder and posterior Pain orientation: Left  PAIN TYPE: sharp, hot Aggravating factors: lifting heavy, going OH Relieving factors: ice, meds, using R arm    OBJECTIVE:   DIAGNOSTIC FINDINGS:  MRI L shoulder IMPRESSION: 1. Two focal near full-thickness rotator cuff tears involving the mid supraspinatus tendon and supraspinatus-infraspinatus interdigitation. 2. Mild intra-articular biceps tendinosis  without tear. 3. Findings suspicious for a posterosuperior labral tear. 4. Mild subacromial-subdeltoid bursitis.   PATIENT SURVEYS:  FOTO 43- Eval D/C 68 FOTO 8th visit- 5.69    TODAY'S TREATMENT:  11/30  Joint mob: L GHJ inf and post mob grade III  UBE L1 70fwd and retro   1lb ABD and flexion 2x10   Wall angel 2x10 Prone I, Y, T 3x10 each 1lb each Counter push up 2x10  11/21  Joint mob: L GHJ inf and post mob grade III     Rhythmic stabilization 30s 4x Prone I, Y, T 3x10 each RTB bilat ER 3x10 RTB diagonals 2x4 each direction. S/L ER 4lbs 2x10  11/18  Joint mob: L GHJ inf and post mob grade III; shoulder clocks grade 4 cardinal directions  UBE L1.5 3 min retro,fwd   Supine protraction 5lb 2x10 Prone I, Y, T 210each YTB bilat ER 2x10 YTB diagonals 2x4 each direction.  _________________________________________   PATIENT EDUCATION: Education details: anatomy, exercise progression, joint protection, thermotherapy, DOMS expectations, muscle firing,  envelope of function, HEP, POC Person educated: Patient Education method: Explanation, Demonstration, Tactile cues, Verbal cues, and Handouts Education comprehension: verbalized understanding, returned demonstration, verbal cues required, and tactile cues required     HOME EXERCISE PROGRAM: Access Code: [email or web address] URL: [email or web address] Date: 10/13/2021 Prepared by: Daleen Bo       ASSESSMENT:   CLINICAL IMPRESSION: Pt able to demonstrate continued progression of exercise today without pain, despite report of pain with stretching at home. Pt able to  perform OH strengthening exercise today but demonstrates fatigue with sustained OKC and CKC positioning. Pt given T/S mobility exercise today in order to improve scapular positioning and posture. HEP progressed at this time. Pt would benefit from continued skilled therapy in order to reach goals and maximize functional L UE strength and ROM for  prevention of further functional decline.    Objective impairments include decreased knowledge of condition, decreased ROM, decreased strength, hypomobility, increased muscle spasms, impaired flexibility, impaired UE functional use, improper body mechanics, postural dysfunction, and pain. These impairments are limiting patient from cleaning, community activity, driving, occupation, yard work, and shopping. Personal factors including Behavior pattern, Past/current experiences, Profession, and 1 comorbidity:    are also affecting patient's functional outcome. Patient will benefit from skilled PT to address above impairments and improve overall function.   REHAB POTENTIAL: Fair     CLINICAL DECISION MAKING: Stable/uncomplicated   EVALUATION COMPLEXITY: Low     GOALS:     SHORT TERM GOALS:   STG Name Target Date Goal status  1 Pt will become independent with HEP in order to demonstrate synthesis of PT education.   10/27/2021 MET  2 Pt will be able to demonstrate full OH reaching ROM in order to demonstrate functional improvement in UE function for self-care and house hold duties.    11/10/2021 MET  3 Pt will report at least 2 pt reduction on NPRS scale for pain in order to demonstrate functional improvement with household activity, self care, and ADL.     11/10/2021 MET    LONG TERM GOALS:    LTG Name Target Date Goal status  1 Pt  will become independent with final HEP in order to demonstrate synthesis of PT education.   11/24/2021 Ongoing  2 Pt will be able to demonstrate within 75-80% strength with dynamometer MMT of R UE in order to demonstrate functional improvement in L shoulder strength and function.     12/08/2021 Ongoing  3 Pt will be able to reach [facility] and carry/hold >5 lbs in order to demonstrate functional improvement in L UE strength for return to PLOF.    12/08/2021 Ongoing  4 Pt will score >/= 68 on FOTO to demonstrate functional improvement in L shoulder function.      12/08/2021 Ongoing    PLAN: PT FREQUENCY: 1-2x/week   PT DURATION: 8 weeks   PLANNED INTERVENTIONS: Therapeutic exercises, Therapeutic activity, Neuro Muscular re-education, Balance training, Gait training, Patient/Family education, Joint mobilization, Vestibular training, Aquatic Therapy, Dry Needling, Electrical stimulation, Spinal mobilization, Cryotherapy, Moist heat, scar mobilization, Taping, Vasopneumatic device, Traction, Ultrasound, Ionotophoresis 4mg /ml Dexamethasone, and Manual therapy   PLAN FOR NEXT SESSION: review HEP, STM/joint mobs, repeat T/S mobility, revisit OKC flex and ABD Daleen Bo PT, DPT 11/18/21 11:01 AM

## 2021-11-18 NOTE — Telephone Encounter (Signed)
Called and spoke with patient to follow up on his dizziness.  He states that following his vestibular evaluation, he is doing quite well and states that he no longer needs vestibular PT.  Pt will continue at [facility] for PT for his shoulder.  No further vestibular PT sessions indicated at this time.

## 2021-11-20 ENCOUNTER — Encounter (HOSPITAL_BASED_OUTPATIENT_CLINIC_OR_DEPARTMENT_OTHER): Payer: Federal, State, Local not specified - PPO | Admitting: Physical Therapy

## 2021-11-26 ENCOUNTER — Ambulatory Visit (HOSPITAL_BASED_OUTPATIENT_CLINIC_OR_DEPARTMENT_OTHER): Payer: Federal, State, Local not specified - PPO | Attending: Orthopaedic Surgery | Admitting: Physical Therapy

## 2021-11-26 ENCOUNTER — Other Ambulatory Visit: Payer: Self-pay

## 2021-11-26 ENCOUNTER — Encounter (HOSPITAL_BASED_OUTPATIENT_CLINIC_OR_DEPARTMENT_OTHER): Payer: Self-pay | Admitting: Physical Therapy

## 2021-11-26 DIAGNOSIS — M6281 Muscle weakness (generalized): Secondary | ICD-10-CM | POA: Diagnosis not present

## 2021-11-26 DIAGNOSIS — G8929 Other chronic pain: Secondary | ICD-10-CM | POA: Insufficient documentation

## 2021-11-26 DIAGNOSIS — M25512 Pain in left shoulder: Secondary | ICD-10-CM | POA: Diagnosis present

## 2021-11-26 DIAGNOSIS — M25612 Stiffness of left shoulder, not elsewhere classified: Secondary | ICD-10-CM | POA: Diagnosis present

## 2021-11-26 NOTE — Therapy (Signed)
OUTPATIENT PHYSICAL THERAPY TREATMENT NOTE   Patient Name: Sergio Hernandez MRN: [id number] DOB:Jun 16, 1976, 45 y.o., male Today's Date: 11/26/2021  PCP: Wendie Agreste, MD REFERRING PROVIDER: Wendie Agreste, MD   PT End of Session - 11/26/21 1157     Visit Number 9    Number of Visits 17    Date for PT Re-Evaluation 01/11/22    Authorization Type BCBS    PT Start Time 1150    PT Stop Time 1230    PT Time Calculation (min) 40 min    Activity Tolerance Patient tolerated treatment well;Patient limited by pain    Behavior During Therapy [facility] for tasks assessed/performed                History reviewed. No pertinent past medical history. Past Surgical History:  Procedure Laterality Date   HERNIA REPAIR     There are no problems to display for this patient.   REFERRING DIAG: M25.512 (ICD-10-CM) - Left shoulder pain, unspecified chronicity   Left RC partial tear. 3-4 weeks conservative management   THERAPY DIAG:  Muscle weakness (generalized)  Chronic left shoulder pain  Stiffness of left shoulder, not elsewhere classified  PERTINENT HISTORY: Post-concussion  PRECAUTIONS: N/A  SUBJECTIVE:  Pt states the shoulder is "sometimes good sometime bad." Pt states the shoulder is weak. It was painful trying to "work and changing a tire."    PERTINENT HISTORY: Post-concussion   PAIN:  Are you having pain? No NPRS scale 0/10 Pain location: L anterior shoulder and posterior Pain orientation: Left  PAIN TYPE: sharp, hot Aggravating factors: lifting heavy, going OH Relieving factors: ice, meds, using R arm    OBJECTIVE:   DIAGNOSTIC FINDINGS:  MRI L shoulder IMPRESSION: 1. Two focal near full-thickness rotator cuff tears involving the mid supraspinatus tendon and supraspinatus-infraspinatus interdigitation. 2. Mild intra-articular biceps tendinosis without tear. 3. Findings suspicious for a posterosuperior labral tear. 4. Mild subacromial-subdeltoid  bursitis.   PATIENT SURVEYS:  FOTO 43- Eval D/C 68 FOTO 8th visit- 56.9    TODAY'S TREATMENT:  12/8  Joint mob: L GHJ inf and post mob grade III  Manual PNF D2 3x10   3lb scaption 3x10   3lb OHP 2x10   Single arm rowing GTB 3x10 Counter push up 2x10 Trialed kneeling pushup- too difficult  11/30  Joint mob: L GHJ inf and post mob grade III  UBE L1 81fwd and retro   1lb ABD and flexion 2x10   Wall angel 2x10 Prone I, Y, T 3x10 each 1lb each Counter push up 2x10  11/21  Joint mob: L GHJ inf and post mob grade III     Rhythmic stabilization 30s 4x Prone I, Y, T 3x10 each RTB bilat ER 3x10 RTB diagonals 2x4 each direction. S/L ER 4lbs 2x10  _________________________________________   PATIENT EDUCATION: Education details: anatomy, exercise progression, joint protection, thermotherapy, DOMS expectations, muscle firing,  envelope of function, HEP, POC Person educated: Patient Education method: Explanation, Demonstration, Tactile cues, Verbal cues, and Handouts Education comprehension: verbalized understanding, returned demonstration, verbal cues required, and tactile cues required     HOME EXERCISE PROGRAM: Access Code: [email or web address] URL: [email or web address] Date: 10/13/2021 Prepared by: Daleen Bo       ASSESSMENT:   CLINICAL IMPRESSION: Due to pt report of trying heavier work/occupational duties at home, PT was able to progress home strengthening exercise without pain. Pt able to incorporate scaption and OHP with 3 lbs being the limit due to fatigue vs pain. Pt did well  with tri-planar resisted motion without pain but has signficant pain with D2 flexion pattern, likely due ER and shoulder flexion weakness. Plan to continue with strengthening as tolerated and begin moderate to light gym based strengthening movements.     Objective impairments include decreased knowledge of condition, decreased ROM, decreased strength, hypomobility, increased muscle  spasms, impaired flexibility, impaired UE functional use, improper body mechanics, postural dysfunction, and pain. These impairments are limiting patient from cleaning, community activity, driving, occupation, yard work, and shopping. Personal factors including Behavior pattern, Past/current experiences, Profession, and 1 comorbidity:    are also affecting patient's functional outcome. Patient will benefit from skilled PT to address above impairments and improve overall function.   REHAB POTENTIAL: Fair     CLINICAL DECISION MAKING: Stable/uncomplicated   EVALUATION COMPLEXITY: Low     GOALS:     SHORT TERM GOALS:   STG Name Target Date Goal status  1 Pt will become independent with HEP in order to demonstrate synthesis of PT education.   10/27/2021 MET  2 Pt will be able to demonstrate full OH reaching ROM in order to demonstrate functional improvement in UE function for self-care and house hold duties.    11/10/2021 MET  3 Pt will report at least 2 pt reduction on NPRS scale for pain in order to demonstrate functional improvement with household activity, self care, and ADL.     11/10/2021 MET    LONG TERM GOALS:    LTG Name Target Date Goal status  1 Pt  will become independent with final HEP in order to demonstrate synthesis of PT education.   11/24/2021 Ongoing  2 Pt will be able to demonstrate within 75-80% strength with dynamometer MMT of R UE in order to demonstrate functional improvement in L shoulder strength and function.     12/08/2021 Ongoing  3 Pt will be able to reach [facility] and carry/hold >5 lbs in order to demonstrate functional improvement in L UE strength for return to PLOF.    12/08/2021 Ongoing  4 Pt will score >/= 68 on FOTO to demonstrate functional improvement in L shoulder function.     12/08/2021 Ongoing    PLAN: PT FREQUENCY: 1-2x/week   PT DURATION: 8 weeks   PLANNED INTERVENTIONS: Therapeutic exercises, Therapeutic activity, Neuro Muscular re-education,  Balance training, Gait training, Patient/Family education, Joint mobilization, Vestibular training, Aquatic Therapy, Dry Needling, Electrical stimulation, Spinal mobilization, Cryotherapy, Moist heat, scar mobilization, Taping, Vasopneumatic device, Traction, Ultrasound, Ionotophoresis 4mg /ml Dexamethasone, and Manual therapy   PLAN FOR NEXT SESSION: review HEP, progress L UE strength, revisit manual PNF, 90/90 strength  Daleen Bo PT, DPT 11/26/21 12:55 PM

## 2021-12-02 ENCOUNTER — Other Ambulatory Visit: Payer: Self-pay

## 2021-12-02 ENCOUNTER — Encounter (HOSPITAL_BASED_OUTPATIENT_CLINIC_OR_DEPARTMENT_OTHER): Payer: Self-pay | Admitting: Physical Therapy

## 2021-12-02 ENCOUNTER — Ambulatory Visit (HOSPITAL_BASED_OUTPATIENT_CLINIC_OR_DEPARTMENT_OTHER): Payer: Federal, State, Local not specified - PPO | Admitting: Physical Therapy

## 2021-12-02 DIAGNOSIS — G8929 Other chronic pain: Secondary | ICD-10-CM

## 2021-12-02 DIAGNOSIS — M6281 Muscle weakness (generalized): Secondary | ICD-10-CM

## 2021-12-02 DIAGNOSIS — M25612 Stiffness of left shoulder, not elsewhere classified: Secondary | ICD-10-CM

## 2021-12-02 DIAGNOSIS — M25512 Pain in left shoulder: Secondary | ICD-10-CM

## 2021-12-02 NOTE — Therapy (Signed)
OUTPATIENT PHYSICAL THERAPY PROGRESS NOTE   Patient Name: Sergio Hernandez MRN: [id number] DOB:1976/04/08, 45 y.o., male Today's Date: 12/02/2021  PCP: Wendie Agreste, MD REFERRING PROVIDER: Wendie Agreste, MD   PT End of Session - 12/02/21 0854     Visit Number 10    Number of Visits 17    Date for PT Re-Evaluation 01/11/22    Authorization Type BCBS    PT Start Time 0850    PT Stop Time 0930    PT Time Calculation (min) 40 min    Activity Tolerance Patient tolerated treatment well;Patient limited by pain    Behavior During Therapy [facility] for tasks assessed/performed                 History reviewed. No pertinent past medical history. Past Surgical History:  Procedure Laterality Date   HERNIA REPAIR     There are no problems to display for this patient.   REFERRING DIAG: M25.512 (ICD-10-CM) - Left shoulder pain, unspecified chronicity   Left RC partial tear. 3-4 weeks conservative management   THERAPY DIAG:  Muscle weakness (generalized)  Chronic left shoulder pain  Stiffness of left shoulder, not elsewhere classified  PERTINENT HISTORY: Post-concussion  PRECAUTIONS: N/A  SUBJECTIVE:  Pt states after a workout   PERTINENT HISTORY: Post-concussion   PAIN:  Are you having pain? No NPRS scale 0/10 Pain location: L anterior shoulder and posterior Pain orientation: Left  PAIN TYPE: sharp, hot Aggravating factors: lifting heavy, going OH Relieving factors: ice, meds, using R arm    OBJECTIVE:   DIAGNOSTIC FINDINGS:  MRI L shoulder IMPRESSION: 1. Two focal near full-thickness rotator cuff tears involving the mid supraspinatus tendon and supraspinatus-infraspinatus interdigitation. 2. Mild intra-articular biceps tendinosis without tear. 3. Findings suspicious for a posterosuperior labral tear. 4. Mild subacromial-subdeltoid bursitis.   PATIENT SURVEYS:  FOTO 43- Eval D/C 68 FOTO 8th visit- 56.9 FOTO 10th Visit-  53.9     UPPER EXTREMITY  AROM/PROM:   A/PROM Right  Left   Shoulder flexion WFL  throughout 145  Shoulder extension   42  Shoulder abduction   140  Shoulder ER   T2  Shoulder internal rotation   T7  (Blank rows = not tested)   UPPER EXTREMITY MMT:   MMT Right  Left   Shoulder flexion  16.1  Shoulder extension  18  Shoulder abduction  16.1  Shoulder ER  18.4  Shoulder IR  17.8  (Blank rows = not tested)   TODAY'S TREATMENT:  12/14  Manual: inf and post L GHJ grade III  UBE L2 4  min retro and fwd Finger ladder 5x flexion and ABD Manual PNF D2 3x10   Prone rowing 5lbs 2x10 90/90 YTB 2x10  12/8  Joint mob: L GHJ inf and post mob grade III  Manual PNF D2 3x10   3lb scaption 3x10   3lb OHP 2x10   Single arm rowing GTB 3x10 Counter push up 2x10 Trialed kneeling pushup- too difficult  11/30  Joint mob: L GHJ inf and post mob grade III  UBE L1 74fwd and retro   1lb ABD and flexion 2x10   Wall angel 2x10 Prone I, Y, T 3x10 each 1lb each Counter push up 2x10  11/21  Joint mob: L GHJ inf and post mob grade III     Rhythmic stabilization 30s 4x Prone I, Y, T 3x10 each RTB bilat ER 3x10 RTB diagonals 2x4 each direction. S/L ER 4lbs 2x10  _________________________________________  PATIENT EDUCATION: Education details: anatomy, exercise progression, joint protection, thermotherapy, DOMS expectations, muscle firing,  envelope of function, HEP, POC Person educated: Patient Education method: Explanation, Demonstration, Tactile cues, Verbal cues, and Handouts Education comprehension: verbalized understanding, returned demonstration, verbal cues required, and tactile cues required     HOME EXERCISE PROGRAM: Access Code: [email or web address] URL: [email or web address] Date: 10/13/2021 Prepared by: Daleen Bo       ASSESSMENT:   CLINICAL IMPRESSION: Pt demonstrates signficant improvement in L UE ROM and strength measures as compared to initial evaluation. Pt's report of  difficulty with lifting heavy objects is due to recent increase in lifting activity and more soreness after working out. Pt with improvement in OH reaching and able to progress exercise into 90/90 position without pain. Pt does improve with ROM following manual therapy due to L GHJ stiffness. Plan to continue with OH and 90/90 strength as tolerated. Plan to decrease frequency of visit    Objective impairments include decreased knowledge of condition, decreased ROM, decreased strength, hypomobility, increased muscle spasms, impaired flexibility, impaired UE functional use, improper body mechanics, postural dysfunction, and pain. These impairments are limiting patient from cleaning, community activity, driving, occupation, yard work, and shopping. Personal factors including Behavior pattern, Past/current experiences, Profession, and 1 comorbidity:    are also affecting patient's functional outcome. Patient will benefit from skilled PT to address above impairments and improve overall function.   REHAB POTENTIAL: Fair     CLINICAL DECISION MAKING: Stable/uncomplicated   EVALUATION COMPLEXITY: Low     GOALS:     SHORT TERM GOALS:   STG Name Target Date Goal status  1 Pt will become independent with HEP in order to demonstrate synthesis of PT education.   10/27/2021 MET  2 Pt will be able to demonstrate full OH reaching ROM in order to demonstrate functional improvement in UE function for self-care and house hold duties.    11/10/2021 MET  3 Pt will report at least 2 pt reduction on NPRS scale for pain in order to demonstrate functional improvement with household activity, self care, and ADL.     11/10/2021 MET    LONG TERM GOALS:    LTG Name Target Date Goal status  1 Pt  will become independent with final HEP in order to demonstrate synthesis of PT education.   11/24/2021 Ongoing  2 Pt will be able to demonstrate within 75-80% strength with dynamometer MMT of R UE in order to demonstrate  functional improvement in L shoulder strength and function.     12/08/2021 Ongoing  3 Pt will be able to reach [facility] and carry/hold >5 lbs in order to demonstrate functional improvement in L UE strength for return to PLOF.    12/08/2021 Ongoing  4 Pt will score >/= 68 on FOTO to demonstrate functional improvement in L shoulder function.     12/08/2021 Ongoing    PLAN: PT FREQUENCY: 1-2x/week   PT DURATION: 8 weeks   PLANNED INTERVENTIONS: Therapeutic exercises, Therapeutic activity, Neuro Muscular re-education, Balance training, Gait training, Patient/Family education, Joint mobilization, Vestibular training, Aquatic Therapy, Dry Needling, Electrical stimulation, Spinal mobilization, Cryotherapy, Moist heat, scar mobilization, Taping, Vasopneumatic device, Traction, Ultrasound, Ionotophoresis 4mg /ml Dexamethasone, and Manual therapy   PLAN FOR NEXT SESSION: review HEP, progress L UE strength, revisit manual PNF, 90/90 strength  Daleen Bo PT, DPT 12/02/21 9:46 AM

## 2021-12-09 ENCOUNTER — Other Ambulatory Visit: Payer: Self-pay

## 2021-12-09 ENCOUNTER — Ambulatory Visit (HOSPITAL_BASED_OUTPATIENT_CLINIC_OR_DEPARTMENT_OTHER): Payer: Federal, State, Local not specified - PPO | Admitting: Physical Therapy

## 2021-12-09 ENCOUNTER — Encounter (HOSPITAL_BASED_OUTPATIENT_CLINIC_OR_DEPARTMENT_OTHER): Payer: Self-pay | Admitting: Physical Therapy

## 2021-12-09 DIAGNOSIS — M25512 Pain in left shoulder: Secondary | ICD-10-CM

## 2021-12-09 DIAGNOSIS — M6281 Muscle weakness (generalized): Secondary | ICD-10-CM

## 2021-12-09 DIAGNOSIS — M25612 Stiffness of left shoulder, not elsewhere classified: Secondary | ICD-10-CM

## 2021-12-09 NOTE — Therapy (Signed)
OUTPATIENT PHYSICAL THERAPY TREATMENT NOTE   Patient Name: Sergio Hernandez MRN: [id number] DOB:1976/05/05, 45 y.o., male Today's Date: 12/09/2021  PCP: Wendie Agreste, MD REFERRING PROVIDER: Wendie Agreste, MD   PT End of Session - 12/09/21 [id number]     Visit Number 11    Number of Visits 17    Date for PT Re-Evaluation 01/11/22    Authorization Type BCBS    PT Start Time 0933    PT Stop Time 1015    PT Time Calculation (min) 42 min    Activity Tolerance Patient tolerated treatment well;Patient limited by pain    Behavior During Therapy [facility] for tasks assessed/performed                  History reviewed. No pertinent past medical history. Past Surgical History:  Procedure Laterality Date   HERNIA REPAIR     There are no problems to display for this patient.    REFERRING DIAG: M25.512 (ICD-10-CM) - Left shoulder pain, unspecified chronicity   Left RC partial tear. 3-4 weeks conservative management   THERAPY DIAG:  Muscle weakness (generalized)  Chronic left shoulder pain  Stiffness of left shoulder, not elsewhere classified  PERTINENT HISTORY: Post-concussion  PRECAUTIONS: N/A  SUBJECTIVE:  Pt states after a workout his shoulder is sore. It goes away in 2-3. He feels stronger after the soreness goes away.    PERTINENT HISTORY: Post-concussion   PAIN:  Are you having pain? No NPRS scale 0/10 Pain location: L anterior shoulder and posterior Pain orientation: Left  PAIN TYPE: sharp, hot Aggravating factors: lifting heavy, going OH Relieving factors: ice, meds, using R arm    OBJECTIVE:   DIAGNOSTIC FINDINGS:  MRI L shoulder IMPRESSION: 1. Two focal near full-thickness rotator cuff tears involving the mid supraspinatus tendon and supraspinatus-infraspinatus interdigitation. 2. Mild intra-articular biceps tendinosis without tear. 3. Findings suspicious for a posterosuperior labral tear. 4. Mild subacromial-subdeltoid bursitis.   PATIENT  SURVEYS:  FOTO 43- Eval D/C 68 FOTO 8th visit- 56.9 FOTO 10th Visit-  53.9  TODAY'S TREATMENT:  12/21  UBE L2 4  min retro and fwd Rhtymic stab 30s 4x Manual PNF D2 3x10 Wall ball CW and CCW 2x20 each   Prone rowing 5lbs 3x10 90/90 YTB 2x10 (rest pause required to finish 2nd set) [name] press 3lbs 10x  12/14  Manual: inf and post L GHJ grade III  UBE L2 4  min retro and fwd Finger ladder 5x flexion and ABD Manual PNF D2 3x10   Prone rowing 5lbs 2x10 90/90 YTB 2x10  12/8  Joint mob: L GHJ inf and post mob grade III  Manual PNF D2 3x10   3lb scaption 3x10   3lb OHP 2x10   Single arm rowing GTB 3x10 Counter push up 2x10 Trialed kneeling pushup- too difficult  11/30  Joint mob: L GHJ inf and post mob grade III  UBE L1 45fwd and retro   1lb ABD and flexion 2x10   Wall angel 2x10 Prone I, Y, T 3x10 each 1lb each Counter push up 2x10  _________________________________________   PATIENT EDUCATION: Education details: anatomy, exercise progression, joint protection, thermotherapy, DOMS expectations, muscle firing,  envelope of function, HEP, POC Person educated: Patient Education method: Explanation, Demonstration, Tactile cues, Verbal cues, and Handouts Education comprehension: verbalized understanding, returned demonstration, verbal cues required, and tactile cues required     HOME EXERCISE PROGRAM: Access Code: [email or web address] URL: [email or web address] Date: 10/13/2021 Prepared by: Daleen Bo  ASSESSMENT:   CLINICAL IMPRESSION: Pt with able to continue with L shoulder strength at today's session with pain or irritation. However, pt does have significant limitation with OH strength limitations at this time. Pt does appear to have improvement in strength in 90/90 position, but has significant fatigue that requires rest pauses. Pt able to add in rotational OH strength but has difficulty with pressing past 130 deg of shoulder flexion. [place] noted  with sustained repetitions. Pt HEP not changed at this time. Plan to continue with OH strength, scapular stabilization, and add in more endurance exercise as tolerated. Pt to decrease frequency and continue with self strengthening at home. If no significant improvement in 2 more visits at reduce frequency, plan to refer back to physician for further medical management.     Objective impairments include decreased knowledge of condition, decreased ROM, decreased strength, hypomobility, increased muscle spasms, impaired flexibility, impaired UE functional use, improper body mechanics, postural dysfunction, and pain. These impairments are limiting patient from cleaning, community activity, driving, occupation, yard work, and shopping. Personal factors including Behavior pattern, Past/current experiences, Profession, and 1 comorbidity:    are also affecting patient's functional outcome. Patient will benefit from skilled PT to address above impairments and improve overall function.   REHAB POTENTIAL: Fair     CLINICAL DECISION MAKING: Stable/uncomplicated   EVALUATION COMPLEXITY: Low     GOALS:     SHORT TERM GOALS:   STG Name Target Date Goal status  1 Pt will become independent with HEP in order to demonstrate synthesis of PT education.   10/27/2021 MET  2 Pt will be able to demonstrate full OH reaching ROM in order to demonstrate functional improvement in UE function for self-care and house hold duties.    11/10/2021 MET  3 Pt will report at least 2 pt reduction on NPRS scale for pain in order to demonstrate functional improvement with household activity, self care, and ADL.     11/10/2021 MET    LONG TERM GOALS:    LTG Name Target Date Goal status  1 Pt  will become independent with final HEP in order to demonstrate synthesis of PT education.   11/24/2021 Ongoing  2 Pt will be able to demonstrate within 75-80% strength with dynamometer MMT of R UE in order to demonstrate functional  improvement in L shoulder strength and function.     12/08/2021 Ongoing  3 Pt will be able to reach [facility] and carry/hold >5 lbs in order to demonstrate functional improvement in L UE strength for return to PLOF.    12/08/2021 Ongoing  4 Pt will score >/= 68 on FOTO to demonstrate functional improvement in L shoulder function.     12/08/2021 Ongoing    PLAN: PT FREQUENCY: 1-2x/week   PT DURATION: 8 weeks   PLANNED INTERVENTIONS: Therapeutic exercises, Therapeutic activity, Neuro Muscular re-education, Balance training, Gait training, Patient/Family education, Joint mobilization, Vestibular training, Aquatic Therapy, Dry Needling, Electrical stimulation, Spinal mobilization, Cryotherapy, Moist heat, scar mobilization, Taping, Vasopneumatic device, Traction, Ultrasound, Ionotophoresis 4mg /ml Dexamethasone, and Manual therapy   PLAN FOR NEXT SESSION: review HEP, progress L UE strength, revisit manual PNF, 90/90 strength, SA wall walk/roll, banded 3 way endurance  Daleen Bo PT, DPT 12/09/21 10:32 AM

## 2021-12-24 ENCOUNTER — Encounter (HOSPITAL_BASED_OUTPATIENT_CLINIC_OR_DEPARTMENT_OTHER): Payer: Self-pay | Admitting: Physical Therapy

## 2021-12-24 ENCOUNTER — Other Ambulatory Visit: Payer: Self-pay

## 2021-12-24 ENCOUNTER — Ambulatory Visit (HOSPITAL_BASED_OUTPATIENT_CLINIC_OR_DEPARTMENT_OTHER): Payer: Federal, State, Local not specified - PPO | Attending: Orthopaedic Surgery | Admitting: Physical Therapy

## 2021-12-24 DIAGNOSIS — M25512 Pain in left shoulder: Secondary | ICD-10-CM | POA: Insufficient documentation

## 2021-12-24 DIAGNOSIS — M6281 Muscle weakness (generalized): Secondary | ICD-10-CM | POA: Diagnosis present

## 2021-12-24 DIAGNOSIS — M25612 Stiffness of left shoulder, not elsewhere classified: Secondary | ICD-10-CM | POA: Diagnosis present

## 2021-12-24 DIAGNOSIS — G8929 Other chronic pain: Secondary | ICD-10-CM | POA: Diagnosis present

## 2021-12-24 NOTE — Therapy (Signed)
OUTPATIENT PHYSICAL THERAPY TREATMENT NOTE   Patient Name: Sergio Hernandez MRN: [id number] DOB:12-Jun-1976, 46 y.o., male Today's Date: 12/24/2021  PCP: Wendie Agreste, MD REFERRING PROVIDER: Wendie Agreste, MD   PT End of Session - 12/24/21 1014     Visit Number 12    Number of Visits 17    Date for PT Re-Evaluation 01/11/22    Authorization Type BCBS    PT Start Time 0930    PT Stop Time 1010    PT Time Calculation (min) 40 min    Activity Tolerance Patient tolerated treatment well;Patient limited by pain    Behavior During Therapy [facility] for tasks assessed/performed                   History reviewed. No pertinent past medical history. Past Surgical History:  Procedure Laterality Date   HERNIA REPAIR     There are no problems to display for this patient.    REFERRING DIAG: M25.512 (ICD-10-CM) - Left shoulder pain, unspecified chronicity   Left RC partial tear. 3-4 weeks conservative management   THERAPY DIAG:  Muscle weakness (generalized)  Chronic left shoulder pain  Stiffness of left shoulder, not elsewhere classified  PERTINENT HISTORY: Post-concussion  PRECAUTIONS: N/A  SUBJECTIVE:  Pt states the shoulder is stronger OH. He states that [place] the [place] and had sharp pain down the arm for 2-3 days. He states it is all gone now.  Pt reports 50% better since starting PT.    PERTINENT HISTORY: Post-concussion   PAIN:  Are you having pain? No NPRS scale 0/10 Pain location: L anterior shoulder and posterior Pain orientation: Left  PAIN TYPE: sharp, hot Aggravating factors: lifting heavy, going OH Relieving factors: ice, meds, using R arm    OBJECTIVE:   DIAGNOSTIC FINDINGS:  MRI L shoulder IMPRESSION: 1. Two focal near full-thickness rotator cuff tears involving the mid supraspinatus tendon and supraspinatus-infraspinatus interdigitation. 2. Mild intra-articular biceps tendinosis without tear. 3. Findings  suspicious for a posterosuperior labral tear. 4. Mild subacromial-subdeltoid bursitis.   PATIENT SURVEYS:  FOTO 43- Eval D/C 68 FOTO 8th visit- 56.9 FOTO 10th Visit-  53.9  TODAY'S TREATMENT:  12/24/21  Manual: inf and post L GHJ mob grade III  UBE L2 4  min retro and fwd Rhtymic stab 30s 4x Manual PNF D2 3x10 Wall ball CW and CCW 2x20 each   Face pull cable 2x10 10lbs 90/90 YTB 2x10 (rest pause required to finish 2nd set) Arnold press 2lbs 2x10  12/21  UBE L2 4  min retro and fwd Rhtymic stab 30s 4x Manual PNF D2 3x10 Wall ball CW and CCW 2x20 each   Prone rowing 5lbs 3x10 90/90 YTB 2x10 (rest pause required to finish 2nd set) Arnold press 3lbs 10x  12/14  Manual: inf and post L GHJ grade III  UBE L2 4  min retro and fwd Finger ladder 5x flexion and ABD Manual PNF D2 3x10   Prone rowing 5lbs 2x10 90/90 YTB 2x10  12/8  Joint mob: L GHJ inf and post mob grade III  Manual PNF D2 3x10   3lb scaption 3x10   3lb OHP 2x10   Single arm rowing GTB 3x10 Counter push up 2x10 Trialed kneeling pushup- too difficult  11/30  Joint mob: L GHJ inf and post mob grade III  UBE L1 85fwd and retro   1lb ABD and flexion 2x10   Wall angel 2x10 Prone I, Y, T 3x10  each 1lb each Counter push up 2x10  _________________________________________   PATIENT EDUCATION: Education details: anatomy, exercise progression, joint protection, thermotherapy, DOMS expectations, muscle firing,  envelope of function, HEP, POC Person educated: Patient Education method: Explanation, Demonstration, Tactile cues, Verbal cues, and Handouts Education comprehension: verbalized understanding, returned demonstration, verbal cues required, and tactile cues required     HOME EXERCISE PROGRAM: Access Code: [email or web address] URL: [email or web address] Date: 10/13/2021 Prepared by: Daleen Bo       ASSESSMENT:   CLINICAL IMPRESSION: Pt with minimal stiffness at beginning of session that  was improved following manual therapy.  Pt able to tolerate OH and scapular stability exercise but with pain today with increased volume of repetitions with OH movement. Pt still with decreased power output of L multiplanar motion. Pt advised to contact MD at this time for potential further medical management- pt inquires about PRP injections. Pt reports 50% improvement but continues to be limited by strength and ability to return to work.    Objective impairments include decreased knowledge of condition, decreased ROM, decreased strength, hypomobility, increased muscle spasms, impaired flexibility, impaired UE functional use, improper body mechanics, postural dysfunction, and pain. These impairments are limiting patient from cleaning, community activity, driving, occupation, yard work, and shopping. Personal factors including Behavior pattern, Past/current experiences, Profession, and 1 comorbidity:    are also affecting patient's functional outcome. Patient will benefit from skilled PT to address above impairments and improve overall function.   REHAB POTENTIAL: Fair     CLINICAL DECISION MAKING: Stable/uncomplicated   EVALUATION COMPLEXITY: Low     GOALS:     SHORT TERM GOALS:   STG Name Target Date Goal status  1 Pt will become independent with HEP in order to demonstrate synthesis of PT education.   10/27/2021 MET  2 Pt will be able to demonstrate full OH reaching ROM in order to demonstrate functional improvement in UE function for self-care and house hold duties.    11/10/2021 MET  3 Pt will report at least 2 pt reduction on NPRS scale for pain in order to demonstrate functional improvement with household activity, self care, and ADL.     11/10/2021 MET    LONG TERM GOALS:    LTG Name Target Date Goal status  1 Pt  will become independent with final HEP in order to demonstrate synthesis of PT education.   11/24/2021 Ongoing  2 Pt will be able to demonstrate within 75-80% strength  with dynamometer MMT of R UE in order to demonstrate functional improvement in L shoulder strength and function.     12/08/2021 Ongoing  3 Pt will be able to reach [facility] and carry/hold >5 lbs in order to demonstrate functional improvement in L UE strength for return to PLOF.    12/08/2021 Ongoing  4 Pt will score >/= 68 on FOTO to demonstrate functional improvement in L shoulder function.     12/08/2021 Ongoing    PLAN: PT FREQUENCY: 1-2x/week   PT DURATION: 8 weeks   PLANNED INTERVENTIONS: Therapeutic exercises, Therapeutic activity, Neuro Muscular re-education, Balance training, Gait training, Patient/Family education, Joint mobilization, Vestibular training, Aquatic Therapy, Dry Needling, Electrical stimulation, Spinal mobilization, Cryotherapy, Moist heat, scar mobilization, Taping, Vasopneumatic device, Traction, Ultrasound, Ionotophoresis 4mg /ml Dexamethasone, and Manual therapy   PLAN FOR NEXT SESSION: review HEP, revisit manual PNF, banded 3 way endurance  Daleen Bo PT, DPT 12/24/21 10:18 AM

## 2022-01-11 ENCOUNTER — Other Ambulatory Visit: Payer: Self-pay

## 2022-01-11 ENCOUNTER — Ambulatory Visit (INDEPENDENT_AMBULATORY_CARE_PROVIDER_SITE_OTHER): Payer: Federal, State, Local not specified - PPO | Admitting: Orthopaedic Surgery

## 2022-01-11 DIAGNOSIS — S46012A Strain of muscle(s) and tendon(s) of the rotator cuff of left shoulder, initial encounter: Secondary | ICD-10-CM

## 2022-01-11 DIAGNOSIS — S46011A Strain of muscle(s) and tendon(s) of the rotator cuff of right shoulder, initial encounter: Secondary | ICD-10-CM
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
## 2022-01-14 ENCOUNTER — Encounter (HOSPITAL_BASED_OUTPATIENT_CLINIC_OR_DEPARTMENT_OTHER): Payer: Self-pay | Admitting: Physical Therapy

## 2022-01-14 ENCOUNTER — Other Ambulatory Visit: Payer: Self-pay

## 2022-01-14 ENCOUNTER — Ambulatory Visit (HOSPITAL_BASED_OUTPATIENT_CLINIC_OR_DEPARTMENT_OTHER): Payer: Federal, State, Local not specified - PPO | Admitting: Physical Therapy

## 2022-01-14 DIAGNOSIS — M6281 Muscle weakness (generalized): Secondary | ICD-10-CM

## 2022-01-14 DIAGNOSIS — M25612 Stiffness of left shoulder, not elsewhere classified: Secondary | ICD-10-CM

## 2022-01-14 DIAGNOSIS — M25512 Pain in left shoulder: Secondary | ICD-10-CM

## 2022-01-14 DIAGNOSIS — G8929 Other chronic pain: Secondary | ICD-10-CM

## 2022-01-14 NOTE — Therapy (Signed)
OUTPATIENT PHYSICAL THERAPY RE-CERT NOTE   Patient Name: Sergio Hernandez MRN: [id number] DOB:04-07-1976, 46 y.o., male Today's Date: 01/14/2022  PCP: Sergio Agreste, MD REFERRING PROVIDER: Wendie Agreste, MD   PT End of Session - 01/14/22 0930     Visit Number 13    Number of Visits 30    Date for PT Re-Evaluation 04/14/22    Authorization Type BCBS    PT Start Time 0930    PT Stop Time 1015    PT Time Calculation (min) 45 min    Activity Tolerance Patient tolerated treatment well;Patient limited by pain    Behavior During Therapy [facility] for tasks assessed/performed                    History reviewed. No pertinent past medical history. Past Surgical History:  Procedure Laterality Date   HERNIA REPAIR     There are no problems to display for this patient.    REFERRING DIAG: M25.512 (ICD-10-CM) - Left shoulder pain, unspecified chronicity   Left RC partial tear. 3-4 weeks conservative management   THERAPY DIAG:  Muscle weakness (generalized) - Plan: PT plan of care cert/re-cert, CANCELED: PT plan of care cert/re-cert  Chronic left shoulder pain - Plan: PT plan of care cert/re-cert, CANCELED: PT plan of care cert/re-cert  Stiffness of left shoulder, not elsewhere classified - Plan: PT plan of care cert/re-cert, CANCELED: PT plan of care cert/re-cert  PERTINENT HISTORY: Post-concussion  PRECAUTIONS: N/A  SUBJECTIVE:  Pt states the shoulder pain is better. He tries to avoid fast and or heavy motions. Pt states the shoulder feels really good right now. He plans to get PRP and then continue therapy.   PERTINENT HISTORY: Post-concussion   PAIN:  Are you having pain? No NPRS scale 0/10 Pain location: L anterior shoulder and posterior Pain orientation: Left  PAIN TYPE: sharp, hot Aggravating factors: lifting heavy, going OH Relieving factors: ice, meds, using R arm    OBJECTIVE:   DIAGNOSTIC FINDINGS:  MRI L shoulder IMPRESSION: 1. Two focal near  full-thickness rotator cuff tears involving the mid supraspinatus tendon and supraspinatus-infraspinatus interdigitation. 2. Mild intra-articular biceps tendinosis without tear. 3. Findings suspicious for a posterosuperior labral tear. 4. Mild subacromial-subdeltoid bursitis.   PATIENT SURVEYS:  FOTO 43- Eval D/C 68 FOTO 8th visit- 56.9 FOTO 10th Visit-  41.3 FOTO at re-cert 50  UPPER EXTREMITY AROM/PROM:   A/PROM Right   Left    Shoulder flexion WFL  throughout 147 p!  Shoulder extension   43  Shoulder abduction   130  Shoulder ER   T2  Shoulder internal rotation   T7  (Blank rows = not tested)   UPPER EXTREMITY MMT: measured in lbs   MMT Right   Left   Left 1/26  Shoulder flexion   16.1 17.4  Shoulder extension   18 18.5  Shoulder abduction   16.1 21.8  Shoulder ER   18.4 18.4  Shoulder IR   17.8 19.5  (Blank rows = not tested)   TODAY'S TREATMENT:  01/11/22   3 way endurance holds YTB 4x 10s holds in each position Wall ball CW and CCW 2x20 each UBE L3 4 min fwd and retro (2 min in each direction, alternating) 90/90 ER and IR RTB 2x10 (rest pause required to finish 2nd set)  12/24/21  Manual: inf and post L GHJ mob grade III  UBE L2 4  min retro and fwd Rhtymic stab 30s 4x Manual PNF D2  3x10 Wall ball CW and CCW 2x20 each   Face pull cable 2x10 10lbs 90/90 YTB 2x10 (rest pause required to finish 2nd set) Sergio Hernandez press 2lbs 2x10  12/21  UBE L2 4  min retro and fwd Rhtymic stab 30s 4x Manual PNF D2 3x10 Wall ball CW and CCW 2x20 each   Prone rowing 5lbs 3x10 90/90 YTB 2x10 (rest pause required to finish 2nd set) Sergio Hernandez press 3lbs 10x  12/14  Manual: inf and post L GHJ grade III  UBE L2 4  min retro and fwd Finger ladder 5x flexion and ABD Manual PNF D2 3x10   Prone rowing 5lbs 2x10 90/90 YTB 2x10  12/8  Joint mob: L GHJ inf and post mob grade III  Manual PNF D2 3x10   3lb scaption 3x10   3lb OHP 2x10   Single arm rowing GTB  3x10 Counter push up 2x10 Trialed kneeling pushup- too difficult   _________________________________________   PATIENT EDUCATION: Education details: anatomy, exercise progression, joint protection, thermotherapy, DOMS expectations, muscle firing,  envelope of function, HEP, POC Person educated: Patient Education method: Explanation, Demonstration, Tactile cues, Verbal cues, and Handouts Education comprehension: verbalized understanding, returned demonstration, verbal cues required, and tactile cues required     HOME EXERCISE PROGRAM: Access Code: [email or web address] URL: [email or web address] Date: 10/13/2021 Prepared by: Sergio Hernandez       ASSESSMENT:   CLINICAL IMPRESSION: Pt appears to have plateaued with therapy. Pt has PRP injection planned at this time to help with end range pain. Plan to continue with therapy after PRP injections in hopes of building L UE strength back up. Pt AROM and strength at this time limited by pain. Pt's perceived function is lower at this time, but he states he feels the shoulder has improved. Pt appears to have apprehension with use of L UE with ADL and occupational activity. Pt reports 50% improvement but continues to be limited by strength and ability to return to work. Place POC on hold at this time and continue post injection rest period.    Objective impairments include decreased knowledge of condition, decreased ROM, decreased strength, hypomobility, increased muscle spasms, impaired flexibility, impaired UE functional use, improper body mechanics, postural dysfunction, and pain. These impairments are limiting patient from cleaning, community activity, driving, occupation, yard work, and shopping. Personal factors including Behavior pattern, Past/current experiences, Profession, and 1 comorbidity:    are also affecting patient's functional outcome. Patient will benefit from skilled PT to address above impairments and improve overall function.    REHAB POTENTIAL: Fair     CLINICAL DECISION MAKING: Stable/uncomplicated   EVALUATION COMPLEXITY: Low     GOALS:     SHORT TERM GOALS:   STG Name Target Date Goal status  1 Pt will become independent with HEP in order to demonstrate synthesis of PT education.   10/27/2021 MET  2 Pt will be able to demonstrate full OH reaching ROM in order to demonstrate functional improvement in UE function for self-care and house hold duties.    11/10/2021 MET  3 Pt will report at least 2 pt reduction on NPRS scale for pain in order to demonstrate functional improvement with household activity, self care, and ADL.     11/10/2021 MET    LONG TERM GOALS:    LTG Name Target Date Goal status  1 Pt  will become independent with final HEP in order to demonstrate synthesis of PT education.   11/24/2021 Ongoing  2 Pt will be able to demonstrate within  75-80% strength with dynamometer MMT of R UE in order to demonstrate functional improvement in L shoulder strength and function.     12/08/2021 Ongoing  3 Pt will be able to reach [facility] and carry/hold >5 lbs in order to demonstrate functional improvement in L UE strength for return to PLOF.    12/08/2021 Ongoing  4 Pt will score >/= 68 on FOTO to demonstrate functional improvement in L shoulder function.     12/08/2021 Ongoing    PLAN: PT FREQUENCY: 1-2x/week   PT DURATION: 8 weeks   PLANNED INTERVENTIONS: Therapeutic exercises, Therapeutic activity, Neuro Muscular re-education, Balance training, Gait training, Patient/Family education, Joint mobilization, Vestibular training, Aquatic Therapy, Dry Needling, Electrical stimulation, Spinal mobilization, Cryotherapy, Moist heat, scar mobilization, Taping, Vasopneumatic device, Traction, Ultrasound, Ionotophoresis 4mg /ml Dexamethasone, and Manual therapy   PLAN FOR NEXT SESSION: review HEP, restart strength training following PRP  Sergio Hernandez PT, DPT 01/14/22 10:21 AM

## 2022-01-25 ENCOUNTER — Ambulatory Visit (HOSPITAL_BASED_OUTPATIENT_CLINIC_OR_DEPARTMENT_OTHER): Payer: Federal, State, Local not specified - PPO | Admitting: Family Medicine

## 2022-01-26 ENCOUNTER — Other Ambulatory Visit: Payer: Self-pay

## 2022-01-26 ENCOUNTER — Ambulatory Visit (INDEPENDENT_AMBULATORY_CARE_PROVIDER_SITE_OTHER): Payer: Federal, State, Local not specified - PPO

## 2022-01-26 ENCOUNTER — Ambulatory Visit (INDEPENDENT_AMBULATORY_CARE_PROVIDER_SITE_OTHER): Payer: Self-pay | Admitting: Family Medicine

## 2022-01-26 ENCOUNTER — Encounter (HOSPITAL_BASED_OUTPATIENT_CLINIC_OR_DEPARTMENT_OTHER): Payer: Self-pay | Admitting: Family Medicine

## 2022-01-26 DIAGNOSIS — S46012A Strain of muscle(s) and tendon(s) of the rotator cuff of left shoulder, initial encounter: Secondary | ICD-10-CM | POA: Diagnosis not present

## 2022-01-26 DIAGNOSIS — M75100 Unspecified rotator cuff tear or rupture of unspecified shoulder, not specified as traumatic: Secondary | ICD-10-CM | POA: Insufficient documentation
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
## 2022-01-26 NOTE — Patient Instructions (Addendum)
°  Medication Instructions:  Your physician has recommended you make the following change in your medication:  -- May take Acetaminophen 650 mg every 6 -8 hors as needed for pain --If you need a refill on any your medications before your next appointment, please call your pharmacy first. If no refills are authorized on file call the office.--  Follow-Up: Your next appointment:   Your physician recommends that you schedule a follow-up appointment in: 2 WEEKS with Dr. de Peru  You will receive a text message or e-mail with a link to a survey about your care and experience with [place] today! We would greatly appreciate your feedback!   Thanks for letting us be apart of your health journey!!  Primary Care and Sports Medicine   Dr. Ceasar Mons Peru   We encourage you to activate your patient portal called "MyChart".  Sign up information is provided on this After Visit Summary.  MyChart is used to connect with patients for Virtual Visits (Telemedicine).  Patients are able to view lab/test results, encounter notes, upcoming appointments, etc.  Non-urgent messages can be sent to your provider as well. To learn more about what you can do with MyChart, please visit --  [email or web address].
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
## 2022-01-28 ENCOUNTER — Encounter (HOSPITAL_BASED_OUTPATIENT_CLINIC_OR_DEPARTMENT_OTHER): Payer: Federal, State, Local not specified - PPO | Admitting: Physical Therapy

## 2022-02-15 ENCOUNTER — Encounter (HOSPITAL_BASED_OUTPATIENT_CLINIC_OR_DEPARTMENT_OTHER): Payer: Self-pay | Admitting: Family Medicine

## 2022-02-15 ENCOUNTER — Other Ambulatory Visit: Payer: Self-pay

## 2022-02-15 ENCOUNTER — Ambulatory Visit (INDEPENDENT_AMBULATORY_CARE_PROVIDER_SITE_OTHER): Payer: Federal, State, Local not specified - PPO | Admitting: Family Medicine

## 2022-02-15 VITALS — Ht 76.0 in | Wt 194.0 lb

## 2022-02-15 DIAGNOSIS — S46012D Strain of muscle(s) and tendon(s) of the rotator cuff of left shoulder, subsequent encounter: Secondary | ICD-10-CM

## 2022-02-15 MED ORDER — IBUPROFEN 600 MG PO TABS: 600.0000 mg | ORAL_TABLET | Freq: Three times a day (TID) | ORAL | 1 refills | Status: DC | PRN

## 2022-02-15 NOTE — Assessment & Plan Note (Signed)
46 year old male seen today in follow-up from recent PRP injection for left rotator cuff tear.  Reports that after PRP, he did have increased pain for about 1 to 2 weeks which is not unexpected.  He does feel that pain has lessened since that time.  He has not resumed physical therapy.  Has gradually increased some activities at home.  Does feel that right shoulder has some increased pain due to using right arm more. Some pain elicited with empty can test, Hawkins.  No significant pain with Neer's test.  Neurovascular exam intact in bilateral upper extremities. At this time, feel that it would be best for patient to resume physical therapy, home exercise program as per PT.  Patient was completing physical therapy downstairs here.  He will reach out to his therapist about resuming PT.  Advised that if they need new referral placed, to let us know and we can place order. Expect gradual improvement in symptoms, function upon resuming physical therapy and home exercise program We will plan for follow-up in about 6 to 8 weeks to monitor progress or sooner as needed

## 2022-02-15 NOTE — Progress Notes (Signed)
° ° °  Procedures performed today:    None.  Independent interpretation of notes and tests performed by another provider:   None.  Brief History, Exam, Impression, and Recommendations:    Ht 6\' 4"  (1.93 m)    Wt 194 lb (88 kg)    BMI 23.61 kg/m   Rotator cuff tear 46 year old male seen today in follow-up from recent PRP injection for left rotator cuff tear.  Reports that after PRP, he did have increased pain for about 1 to 2 weeks which is not unexpected.  He does feel that pain has lessened since that time.  He has not resumed physical therapy.  Has gradually increased some activities at home.  Does feel that right shoulder has some increased pain due to using right arm more. Some pain elicited with empty can test, Hawkins.  No significant pain with Neer's test.  Neurovascular exam intact in bilateral upper extremities. At this time, feel that it would be best for patient to resume physical therapy, home exercise program as per PT.  Patient was completing physical therapy downstairs here.  He will reach out to his therapist about resuming PT.  Advised that if they need new referral placed, to let 57 know and we can place order. Expect gradual improvement in symptoms, function upon resuming physical therapy and home exercise program We will plan for follow-up in about 6 to 8 weeks to monitor progress or sooner as needed   ___________________________________________ Sergio Mata de [place], MD, ABFM, CAQSM Primary Care and Sports Medicine [facility]

## 2022-02-15 NOTE — Patient Instructions (Signed)
°  Medication Instructions:  Your physician recommends that you continue on your current medications as directed. Please refer to the Current Medication list given to you today. --If you need a refill on any your medications before your next appointment, please call your pharmacy first. If no refills are authorized on file call the office.--  Follow-Up: Your next appointment:   Your physician recommends that you schedule a follow-up appointment in: 6-8 WEEKS with Dr. de Peru      Thanks for letting us be apart of your health journey!!        [facility] Sports Medicine   Dr. Raymond de Peru II., MD, MPH               We recommend signing up for the patient portal called "MyChart".  Sign up information is provided on this After Visit Summary.  MyChart is used to connect with patients for Virtual Visits (Telemedicine).  Patients are able to view lab/test results, encounter notes, upcoming appointments, etc.  Non-urgent messages can be sent to your provider as well.   To learn more about what you can do with MyChart, please visit --  [email or web address].

## 2022-03-02 ENCOUNTER — Other Ambulatory Visit: Payer: Self-pay

## 2022-03-02 ENCOUNTER — Ambulatory Visit (HOSPITAL_BASED_OUTPATIENT_CLINIC_OR_DEPARTMENT_OTHER): Payer: Federal, State, Local not specified - PPO | Attending: Orthopaedic Surgery | Admitting: Physical Therapy

## 2022-03-02 ENCOUNTER — Encounter (HOSPITAL_BASED_OUTPATIENT_CLINIC_OR_DEPARTMENT_OTHER): Payer: Self-pay | Admitting: Physical Therapy

## 2022-03-02 DIAGNOSIS — M6281 Muscle weakness (generalized): Secondary | ICD-10-CM | POA: Diagnosis present

## 2022-03-02 DIAGNOSIS — G8929 Other chronic pain: Secondary | ICD-10-CM | POA: Diagnosis present

## 2022-03-02 DIAGNOSIS — M25512 Pain in left shoulder: Secondary | ICD-10-CM | POA: Diagnosis present

## 2022-03-02 DIAGNOSIS — M25612 Stiffness of left shoulder, not elsewhere classified: Secondary | ICD-10-CM | POA: Diagnosis present

## 2022-03-02 NOTE — Therapy (Signed)
?OUTPATIENT PHYSICAL THERAPY RE-CERT NOTE ? ? ?Patient Name: Sergio Hernandez ?MRN: [id number] ?DOB:[birth year], 46 y.o., male ?Today's Date: 03/02/2022 ? ?PCP: Wendie Agreste, MD ?REFERRING PROVIDER: Wendie Agreste, MD ? ? PT End of Session - 03/02/22 1558   ? ? Visit Number 14   ? Number of Visits 30   ? Date for PT Re-Evaluation 04/14/22   ? Authorization Type BCBS   ? PT Start Time 1558   ? PT Stop Time 1640   ? PT Time Calculation (min) 42 min   ? Activity Tolerance Patient tolerated treatment well;Patient limited by pain   ? Behavior During Therapy [facility] for tasks assessed/performed   ? ?  ?  ? ?  ? ? ? ? ? ? ? ? ? ? ?History reviewed. No pertinent past medical history. ?Past Surgical History:  ?Procedure Laterality Date  ? HERNIA REPAIR    ? ?Patient Active Problem List  ? Diagnosis Date Noted  ? Rotator cuff tear 01/26/2022  ? ? ? ?REFERRING DIAG: M25.512 (ICD-10-CM) - Left shoulder pain, unspecified chronicity  ? ?Left RC partial tear. 3-4 weeks conservative management  ? ?THERAPY DIAG:  ?Muscle weakness (generalized) ? ?Chronic left shoulder pain ? ?Stiffness of left shoulder, not elsewhere classified ? ?PERTINENT HISTORY: Post-concussion ? ?PRECAUTIONS: N/A ? ?SUBJECTIVE:  ?Pt states that he had PRP injection and had increased pain. This was about a month ago. He states that within the last week, it has gotten better. He has avoided using the L shoulder due to the pain. The L shoulder feels weak and hurts more at night.  ? ?Pt states he stopped HEP as instructed and has not restarted.  ? ?PERTINENT HISTORY: ?Post-concussion ?  ?PAIN:  ?Are you having pain? Yes ?NPRS scale 7/10 ?Pain location: L anterior shoulder and posterior, anterior  ?Pain orientation: Left  ?PAIN TYPE: sharp, hot ?Aggravating factors: lifting heavy, going OH ?Relieving factors: ice, meds, using R arm ? ? ? ?OBJECTIVE:  ? ?DIAGNOSTIC FINDINGS:  ?MRI L shoulder IMPRESSION: ?1. Two focal near full-thickness rotator cuff tears involving  the ?mid supraspinatus tendon and supraspinatus-infraspinatus ?interdigitation. ?2. Mild intra-articular biceps tendinosis without tear. ?3. Findings suspicious for a posterosuperior labral tear. ?4. Mild subacromial-subdeltoid bursitis. ?  ?PATIENT SURVEYS:  ?FOTO 60- Eval ?D/C 34 ?FOTO 8th visit- 56.9 ?FOTO 10th Visit-  53.9 ?FOTO at re-cert 22 ? ?FOTO Re-Eval 50 pts ? ?UPPER EXTREMITY AROM/PROM: ?  ?A/PROM Right ?  Left ?  L ?3/14  ?Shoulder flexion WFL  ?throughout 147 p! 125 p!  ?Shoulder extension   43 48  ?Shoulder abduction   130 90  ?Shoulder ER   T2 occiput  ?Shoulder internal rotation   T7 L2  ?(Blank rows = not tested) ?  ?UPPER EXTREMITY MMT: measured in lbs ?  ?MMT Right ?  Left ?  Left 1/26 L  ?3/14  ?Shoulder flexion   16.1 17.4 16.7 p!  ?Shoulder extension   18 18.5 24.8  ?Shoulder abduction   16.1 21.8 16.6  ?Shoulder ER   18.4 18.4 11.7  ?Shoulder IR   17.8 19.5 25.2  ?(Blank rows = not tested) ? ? ?TODAY'S TREATMENT: ? ?3/14 ? ?STM: posterior L shoulder- infra, supra, mid trap ?Joint mob: L inf GHJ grade II-III ? ?Tennis ball self massage ?Table flexion 3s 10x ?Table ABD 3s 10x ?Scap squeeze 20x ?Isometric ER 10x ? ? ? ?01/11/22 ? ? ?3 way endurance holds YTB 4x 10s holds in each position ?Wall  ball CW and CCW 2x20 each ?UBE L3 4 min fwd and retro (2 min in each direction, alternating) ?90/90 ER and IR RTB 2x10 (rest pause required to finish 2nd set) ? ?12/24/21 ? ?Manual: inf and post L GHJ mob grade III ? ?UBE L2 4  min retro and fwd ?Rhtymic stab 30s 4x ?Manual PNF D2 3x10 ?Wall ball CW and CCW 2x20 each ?  Face pull cable 2x10 10lbs ?90/90 YTB 2x10 (rest pause required to finish 2nd set) ?Arnold press 2lbs 2x10 ? ?12/21 ? ?UBE L2 4  min retro and fwd ?Rhtymic stab 30s 4x ?Manual PNF D2 3x10 ?Wall ball CW and CCW 2x20 each ?  Prone rowing 5lbs 3x10 ?90/90 YTB 2x10 (rest pause required to finish 2nd set) ?Arnold press 3lbs 10x ? ?12/14 ? ?Manual: inf and post L GHJ grade III ? ?UBE L2 4  min retro  and fwd ?Finger ladder 5x flexion and ABD ?Manual PNF D2 3x10 ?  Prone rowing 5lbs 2x10 ?90/90 YTB 2x10 ? ?12/8 ? ?Joint mob: L GHJ inf and post mob grade III ? ?Manual PNF D2 3x10 ?  3lb scaption 3x10 ?  3lb OHP 2x10 ?  Single arm rowing GTB 3x10 ?Counter push up 2x10 ?Trialed kneeling pushup- too difficult ? ? ?_________________________________________ ?  ?PATIENT EDUCATION: ?Education details: anatomy, exercise progression, joint protection, thermotherapy, DOMS expectations, muscle firing,  envelope of function, HEP, POC ?Person educated: Patient ?Education method: Explanation, Demonstration, Tactile cues, Verbal cues, and Handouts ?Education comprehension: verbalized understanding, returned demonstration, verbal cues required, and tactile cues required ?  ?  ?HOME EXERCISE PROGRAM: ?Access Code: [email or web address] ?URL: [email or web address] ?Date: 10/13/2021 ?Prepared by: Daleen Bo ?  ?  ?  ?ASSESSMENT: ?  ?CLINICAL IMPRESSION: ?Pt returns to to therapy following PRP injection. Pt does present with increased joint stiffness, soft tissue restriction, and pain inhibition of the L shoulder. However, did respond well exercise and manual therapy at today's session with improve OH reaching following. Plan to continue with PROM, joint mobs, and AAROM and build up to resisted exercise as before. HEP updated at this time. Pt is largely pain limited at this time. ? ? ?Objective impairments include decreased knowledge of condition, decreased ROM, decreased strength, hypomobility, increased muscle spasms, impaired flexibility, impaired UE functional use, improper body mechanics, postural dysfunction, and pain. These impairments are limiting patient from cleaning, community activity, driving, occupation, yard work, and shopping. Personal factors including Behavior pattern, Past/current experiences, Profession, and 1 comorbidity:    are also affecting patient's functional outcome. Patient will benefit from skilled PT to  address above impairments and improve overall function. ?  ?REHAB POTENTIAL: Fair   ?  ?CLINICAL DECISION MAKING: Stable/uncomplicated ?  ?EVALUATION COMPLEXITY: Low ?  ?  ?GOALS: ?  ?  ?SHORT TERM GOALS: ?  ?STG Name Target Date Goal status  ?1 Pt will become independent with HEP in order to demonstrate synthesis of PT education. ?  10/27/2021 MET  ?2 Pt will be able to demonstrate full OH reaching ROM in order to demonstrate functional improvement in UE function for self-care and house hold duties.  ?  11/10/2021 MET  ?3 Pt will report at least 2 pt reduction on NPRS scale for pain in order to demonstrate functional improvement with household activity, self care, and ADL. ?  ?  11/10/2021 MET  ?  ?LONG TERM GOALS:  ?  ?LTG Name Target Date Goal status  ?1 Pt  will become independent with final HEP in  order to demonstrate synthesis of PT education. ?  11/24/2021 Ongoing  ?2 Pt will be able to demonstrate within 75-80% strength with dynamometer MMT of R UE in order to demonstrate functional improvement in L shoulder strength and function. ?  ?  12/08/2021 Ongoing  ?3 Pt will be able to reach [facility] and carry/hold >5 lbs in order to demonstrate functional improvement in L UE strength for return to PLOF.  ?  12/08/2021 Ongoing  ?4 Pt will score >/= 68 on FOTO to demonstrate functional improvement in L shoulder function. ?  ?  12/08/2021 Ongoing  ?  ?PLAN: ?PT FREQUENCY: 1-2x/week ?  ?PT DURATION: 8 weeks ?  ?PLANNED INTERVENTIONS: Therapeutic exercises, Therapeutic activity, Neuro Muscular re-education, Balance training, Gait training, Patient/Family education, Joint mobilization, Vestibular training, Aquatic Therapy, Dry Needling, Electrical stimulation, Spinal mobilization, Cryotherapy, Moist heat, scar mobilization, Taping, Vasopneumatic device, Traction, Ultrasound, Ionotophoresis 4mg /ml Dexamethasone, and Manual therapy ?  ?PLAN FOR NEXT SESSION: review HEP, improve ROM, joint mobs, STM ? ?Daleen Bo PT, DPT ?03/02/22  4:40 PM ? ? ?   ?

## 2022-03-26 ENCOUNTER — Encounter (HOSPITAL_BASED_OUTPATIENT_CLINIC_OR_DEPARTMENT_OTHER): Payer: Self-pay | Admitting: Physical Therapy

## 2022-03-26 ENCOUNTER — Ambulatory Visit (HOSPITAL_BASED_OUTPATIENT_CLINIC_OR_DEPARTMENT_OTHER): Payer: Federal, State, Local not specified - PPO | Attending: Orthopaedic Surgery | Admitting: Physical Therapy

## 2022-03-26 DIAGNOSIS — M6281 Muscle weakness (generalized): Secondary | ICD-10-CM | POA: Insufficient documentation

## 2022-03-26 DIAGNOSIS — G8929 Other chronic pain: Secondary | ICD-10-CM | POA: Insufficient documentation

## 2022-03-26 DIAGNOSIS — M25512 Pain in left shoulder: Secondary | ICD-10-CM | POA: Diagnosis present

## 2022-03-26 DIAGNOSIS — M25612 Stiffness of left shoulder, not elsewhere classified: Secondary | ICD-10-CM | POA: Insufficient documentation

## 2022-03-26 NOTE — Therapy (Signed)
?OUTPATIENT PHYSICAL THERAPY TREATMENT NOTE ? ? ?Patient Name: Sergio Hernandez ?MRN: [id number] ?DOB:02-Sep-1976, 46 y.o., male ?Today's Date: 03/26/2022 ? ?PCP: Wendie Agreste, MD ?REFERRING PROVIDER: Wendie Agreste, MD ? ? PT End of Session - 03/26/22 0929   ? ? Visit Number 15   ? Number of Visits 30   ? Date for PT Re-Evaluation 04/14/22   ? Authorization Type BCBS   ? PT Start Time 0930   ? PT Stop Time 1010   ? PT Time Calculation (min) 40 min   ? Activity Tolerance Patient tolerated treatment well;Patient limited by pain   ? Behavior During Therapy [facility] for tasks assessed/performed   ? ?  ?  ? ?  ? ? ? ? ? ? ? ? ? ? ?History reviewed. No pertinent past medical history. ?Past Surgical History:  ?Procedure Laterality Date  ? HERNIA REPAIR    ? ?Patient Active Problem List  ? Diagnosis Date Noted  ? Rotator cuff tear 01/26/2022  ? ? ? ?REFERRING DIAG: M25.512 (ICD-10-CM) - Left shoulder pain, unspecified chronicity  ? ?Left RC partial tear. 3-4 weeks conservative management  ? ?THERAPY DIAG:  ?Muscle weakness (generalized) ? ?Chronic left shoulder pain ? ?Stiffness of left shoulder, not elsewhere classified ? ?PERTINENT HISTORY: Post-concussion ? ?PRECAUTIONS: N/A ? ?SUBJECTIVE:  ?Pt states that the shoulder has had less pain and cracking. He notices that when it is cold, he will feel more pain than when it is warm. ? ?PERTINENT HISTORY: ?Post-concussion ?  ?PAIN:  ?Are you having pain? Yes ?NPRS scale 3/10 ?Pain location: L anterior shoulder and posterior, anterior  ?Pain orientation: Left  ?PAIN TYPE: sharp, hot ?Aggravating factors: lifting heavy, going OH ?Relieving factors: ice, meds, using R arm ? ? ? ?OBJECTIVE:  ? ?DIAGNOSTIC FINDINGS:  ?MRI L shoulder IMPRESSION: ?1. Two focal near full-thickness rotator cuff tears involving the ?mid supraspinatus tendon and supraspinatus-infraspinatus ?interdigitation. ?2. Mild intra-articular biceps tendinosis without tear. ?3. Findings suspicious for a posterosuperior  labral tear. ?4. Mild subacromial-subdeltoid bursitis. ?  ?PATIENT SURVEYS:  ?FOTO 8- Eval ?D/C 63 ?FOTO 8th visit- 56.9 ?FOTO 10th Visit-  53.9 ?FOTO at re-cert 90 ? ?FOTO Re-Eval 50 pts ? ? ? ?TODAY'S TREATMENT: ? ?4/7 ? ?STM: posterior L shoulder- infra, supra, mid trap ?Joint mob: L inf GHJ grade II-III ? ?Cane ABD 3s 10x ?Rowing 2x10 GTB ?GTB ext 2x10 ?YTB ER 2x10 ?YTB diagonals 2x5 ? ? ? ? ? ?3/14 ? ?STM: posterior L shoulder- infra, supra, mid trap ?Joint mob: L inf GHJ grade II-III ? ?Tennis ball self massage ?Table flexion 3s 10x ?Table ABD 3s 10x ?Scap squeeze 20x ?Isometric ER 10x ? ? ? ?01/11/22 ? ? ?3 way endurance holds YTB 4x 10s holds in each position ?Wall ball CW and CCW 2x20 each ?UBE L3 4 min fwd and retro (2 min in each direction, alternating) ?90/90 ER and IR RTB 2x10 (rest pause required to finish 2nd set) ? ?12/24/21 ? ?Manual: inf and post L GHJ mob grade III ? ?UBE L2 4  min retro and fwd ?Rhtymic stab 30s 4x ?Manual PNF D2 3x10 ?Wall ball CW and CCW 2x20 each ?  Face pull cable 2x10 10lbs ?90/90 YTB 2x10 (rest pause required to finish 2nd set) ?Arnold press 2lbs 2x10 ? ?12/21 ? ?UBE L2 4  min retro and fwd ?Rhtymic stab 30s 4x ?Manual PNF D2 3x10 ?Wall ball CW and CCW 2x20 each ?  Prone rowing 5lbs 3x10 ?90/90 YTB 2x10 (  rest pause required to finish 2nd set) ?Arnold press 3lbs 10x ? ?12/14 ? ?Manual: inf and post L GHJ grade III ? ?UBE L2 4  min retro and fwd ?Finger ladder 5x flexion and ABD ?Manual PNF D2 3x10 ?  Prone rowing 5lbs 2x10 ?90/90 YTB 2x10 ? ?12/8 ? ?Joint mob: L GHJ inf and post mob grade III ? ?Manual PNF D2 3x10 ?  3lb scaption 3x10 ?  3lb OHP 2x10 ?  Single arm rowing GTB 3x10 ?Counter push up 2x10 ?Trialed kneeling pushup- too difficult ? ? ?_________________________________________ ?  ?PATIENT EDUCATION: ?Education details: anatomy, exercise progression, joint protection, thermotherapy, DOMS expectations, muscle firing,  envelope of function, HEP, POC ?Person educated:  Patient ?Education method: Explanation, Demonstration, Tactile cues, Verbal cues, and Handouts ?Education comprehension: verbalized understanding, returned demonstration, verbal cues required, and tactile cues required ?  ?  ?HOME EXERCISE PROGRAM: ?Access Code: [email or web address] ?URL: [email or web address] ?Date: 03/26/2022 ?Prepared by: Daleen Bo ? ?Exercises ?- Standing Shoulder Abduction AAROM with Dowel  - 1 x daily - 7 x weekly - 2 sets - 10 reps ?- Standing Shoulder Row with Anchored Resistance  - 1 x daily - 3 x weekly - 2 sets - 10 reps ?- Shoulder External Rotation and Scapular Retraction with Resistance  - 1 x daily - 3 x weekly - 2 sets - 10 reps ?- Standing Shoulder Diagonal Horizontal Abduction 60/120 Degrees with Resistance  - 1 x daily - 3 x weekly - 1 sets - 5 reps ? ?Patient Education ?- Trigger Point Dry Needling ?  ?  ?ASSESSMENT: ?  ?CLINICAL IMPRESSION: ?Pt able to demonstrate improved OH ROM at today's session but is still limited to 95 deg of AROM ABD. Pt flexion has improved significantly but still largely limited by pain. Pt was able to introduce strengthening exericse into HEP but needed to keep motions below shoulder level to reduce pain. Pt did have good response to STM at today's session and is considering TPDN at next session. Edu provided via printout and pt to decided at next session if he would like to pursue this for his pain management and facilitation of exercise.  ? ? ?Objective impairments include decreased knowledge of condition, decreased ROM, decreased strength, hypomobility, increased muscle spasms, impaired flexibility, impaired UE functional use, improper body mechanics, postural dysfunction, and pain. These impairments are limiting patient from cleaning, community activity, driving, occupation, yard work, and shopping. Personal factors including Behavior pattern, Past/current experiences, Profession, and 1 comorbidity:    are also affecting patient's functional  outcome. Patient will benefit from skilled PT to address above impairments and improve overall function. ?  ?REHAB POTENTIAL: Fair   ?  ?CLINICAL DECISION MAKING: Stable/uncomplicated ?  ?EVALUATION COMPLEXITY: Low ?  ?  ?GOALS: ?  ?  ?SHORT TERM GOALS: ?  ?STG Name Target Date Goal status  ?1 Pt will become independent with HEP in order to demonstrate synthesis of PT education. ?  10/27/2021 MET  ?2 Pt will be able to demonstrate full OH reaching ROM in order to demonstrate functional improvement in UE function for self-care and house hold duties.  ?  11/10/2021 MET  ?3 Pt will report at least 2 pt reduction on NPRS scale for pain in order to demonstrate functional improvement with household activity, self care, and ADL. ?  ?  11/10/2021 MET  ?  ?LONG TERM GOALS:  ?  ?LTG Name Target Date Goal status  ?1 Pt  will become independent with final HEP  in order to demonstrate synthesis of PT education. ?  11/24/2021 Ongoing  ?2 Pt will be able to demonstrate within 75-80% strength with dynamometer MMT of R UE in order to demonstrate functional improvement in L shoulder strength and function. ?  ?  12/08/2021 Ongoing  ?3 Pt will be able to reach [facility] and carry/hold >5 lbs in order to demonstrate functional improvement in L UE strength for return to PLOF.  ?  12/08/2021 Ongoing  ?4 Pt will score >/= 68 on FOTO to demonstrate functional improvement in L shoulder function. ?  ?  12/08/2021 Ongoing  ?  ?PLAN: ?PT FREQUENCY: 1-2x/week ?  ?PT DURATION: 8 weeks ?  ?PLANNED INTERVENTIONS: Therapeutic exercises, Therapeutic activity, Neuro Muscular re-education, Balance training, Gait training, Patient/Family education, Joint mobilization, Vestibular training, Aquatic Therapy, Dry Needling, Electrical stimulation, Spinal mobilization, Cryotherapy, Moist heat, scar mobilization, Taping, Vasopneumatic device, Traction, Ultrasound, Ionotophoresis 4mg /ml Dexamethasone, and Manual therapy ?  ?PLAN FOR NEXT SESSION: review HEP, improve ROM,  joint mobs, STM ? ?Daleen Bo PT, DPT ?03/26/22 10:19 AM ? ? ?   ?

## 2022-04-12 ENCOUNTER — Encounter (HOSPITAL_BASED_OUTPATIENT_CLINIC_OR_DEPARTMENT_OTHER): Payer: Self-pay | Admitting: Family Medicine

## 2022-04-12 ENCOUNTER — Ambulatory Visit (INDEPENDENT_AMBULATORY_CARE_PROVIDER_SITE_OTHER): Payer: Federal, State, Local not specified - PPO | Admitting: Family Medicine

## 2022-04-12 VITALS — BP 124/82 | HR 85 | Ht 76.0 in | Wt 196.0 lb

## 2022-04-12 DIAGNOSIS — S46012D Strain of muscle(s) and tendon(s) of the rotator cuff of left shoulder, subsequent encounter: Secondary | ICD-10-CM

## 2022-04-12 NOTE — Progress Notes (Signed)
? ? ?  Procedures performed today:   ? ?None. ? ?Independent interpretation of notes and tests performed by another provider:  ? ?None. ? ?Brief History, Exam, Impression, and Recommendations:   ? ?BP 124/82 (BP Location: Right Arm, Patient Position: Sitting, Cuff Size: Normal)   Pulse 85   Ht 6\' 4"  (1.93 m)   Wt 196 lb (88.9 kg)   SpO2 98%   BMI 23.86 kg/m?  ? ?Rotator cuff tear ?Patient reports that recently he has been having improvement in pain as well as range of motion.  He has been working with physical therapy, doing home exercise program as per PT.  Does have another visit upcoming with physical therapy.  Indicates that he has been able to progress with some strengthening of the shoulder.  Since the injection, he feels about 50% improved.  He has not had any new symptoms such as numbness and tingling ?On exam, still has some pain with certain movements, particularly with empty can, Hawkins, Neer's.  Passive and active range of motion are improved ?At this time, recommend that he continue working with physical therapy, home exercise program as per PT ?Feel that patient should continue to have gradual improvement in symptoms and working with physical therapy.  Ultimately, we will need to monitor if he is able to return to near his baseline or how impactful any residual symptoms are in regards to quality of life and activities that patient would like to return to ? ?Plan for follow-up in 3 months or sooner as needed ? ? ?___________________________________________ ?Kamaria Lucia de , MD, ABFM, CAQSM ?Primary Care and Sports Medicine ?[facility] ?

## 2022-04-12 NOTE — Assessment & Plan Note (Signed)
Patient reports that recently he has been having improvement in pain as well as range of motion.  He has been working with physical therapy, doing home exercise program as per PT.  Does have another visit upcoming with physical therapy.  Indicates that he has been able to progress with some strengthening of the shoulder.  Since the injection, he feels about 50% improved.  He has not had any new symptoms such as numbness and tingling ?On exam, still has some pain with certain movements, particularly with empty can, Hawkins, Neer's.  Passive and active range of motion are improved ?At this time, recommend that he continue working with physical therapy, home exercise program as per PT ?Feel that patient should continue to have gradual improvement in symptoms and working with physical therapy.  Ultimately, we will need to monitor if he is able to return to near his baseline or how impactful any residual symptoms are in regards to quality of life and activities that patient would like to return to ?

## 2022-04-14 ENCOUNTER — Encounter (HOSPITAL_BASED_OUTPATIENT_CLINIC_OR_DEPARTMENT_OTHER): Payer: Self-pay | Admitting: Physical Therapy

## 2022-04-14 ENCOUNTER — Ambulatory Visit (HOSPITAL_BASED_OUTPATIENT_CLINIC_OR_DEPARTMENT_OTHER): Payer: Federal, State, Local not specified - PPO | Admitting: Physical Therapy

## 2022-04-14 DIAGNOSIS — M25612 Stiffness of left shoulder, not elsewhere classified: Secondary | ICD-10-CM

## 2022-04-14 DIAGNOSIS — G8929 Other chronic pain: Secondary | ICD-10-CM

## 2022-04-14 DIAGNOSIS — M6281 Muscle weakness (generalized): Secondary | ICD-10-CM

## 2022-04-14 NOTE — Therapy (Signed)
?OUTPATIENT PHYSICAL THERAPY TREATMENT NOTE ? ? ?Patient Name: Sergio Hernandez ?MRN: [id number] ?DOB:[birth year], 46 y.o., male ?Today's Date: 04/14/2022 ? ?PCP: Wendie Agreste, MD ?REFERRING PROVIDER: Wendie Agreste, MD ? ? PT End of Session - 04/14/22 1300   ? ? Visit Number 16   ? Number of Visits 30   ? Date for PT Re-Evaluation 04/14/22   ? Authorization Type BCBS   ? PT Start Time 1300   ? PT Stop Time 1340   ? PT Time Calculation (min) 40 min   ? Activity Tolerance Patient tolerated treatment well;Patient limited by pain   ? Behavior During Therapy [facility] for tasks assessed/performed   ? ?  ?  ? ?  ? ? ? ? ? ? ? ? ? ? ?History reviewed. No pertinent past medical history. ?Past Surgical History:  ?Procedure Laterality Date  ? HERNIA REPAIR    ? ?Patient Active Problem List  ? Diagnosis Date Noted  ? Rotator cuff tear 01/26/2022  ? ? ? ?REFERRING DIAG: M25.512 (ICD-10-CM) - Left shoulder pain, unspecified chronicity  ? ?Left RC partial tear. 3-4 weeks conservative management  ? ?THERAPY DIAG:  ?Muscle weakness (generalized) ? ?Chronic left shoulder pain ? ?Stiffness of left shoulder, not elsewhere classified ? ?PERTINENT HISTORY: Post-concussion ? ?PRECAUTIONS: N/A ? ?SUBJECTIVE:  ?Pt states the shoulder feels better. He has less cracking and is able to move it better. He states the pain is down but fast motions or lifting above head is still painful. ? ?PERTINENT HISTORY: ?Post-concussion ?  ?PAIN:  ?Are you having pain? Yes ?NPRS scale 2/10 ?Pain location: L anterior shoulder and posterior, anterior  ?Pain orientation: Left  ?PAIN TYPE: sharp, hot ?Aggravating factors: lifting heavy, going OH ?Relieving factors: ice, meds, using R arm ? ? ? ?OBJECTIVE:  ? ?DIAGNOSTIC FINDINGS:  ?MRI L shoulder IMPRESSION: ?1. Two focal near full-thickness rotator cuff tears involving the ?mid supraspinatus tendon and supraspinatus-infraspinatus ?interdigitation. ?2. Mild intra-articular biceps tendinosis without tear. ?3. Findings  suspicious for a posterosuperior labral tear. ?4. Mild subacromial-subdeltoid bursitis. ?  ?PATIENT SURVEYS:  ?FOTO 67- Eval ?D/C 31 ?FOTO 8th visit- 56.9 ?FOTO 10th Visit-  53.9 ?FOTO at re-cert 67 ? ?FOTO Re-Eval 50 pts ? ? ? ?TODAY'S TREATMENT: ? ?4/26 ? ?STM: posterior L shoulder- infra, supra, mid trap ?Joint mob: L inf GHJ grade II-III ? ?Biceps 5lbs 2x10 ?Tricep kickback 5lbs 2x10 ?GTB ext 2x10 ?RTB ER 2x10 ?RTB diagonals 2x5 ?Wall slide 2x10 flexion and ABD ? ?4/7 ? ?STM: posterior L shoulder- infra, supra, mid trap ?Joint mob: L inf GHJ grade II-III ? ?Cane ABD 3s 10x ?Rowing 2x10 GTB ?GTB ext 2x10 ?YTB ER 2x10 ?YTB diagonals 2x5 ? ? ? ? ? ?3/14 ? ?STM: posterior L shoulder- infra, supra, mid trap ?Joint mob: L inf GHJ grade II-III ? ?Tennis ball self massage ?Table flexion 3s 10x ?Table ABD 3s 10x ?Scap squeeze 20x ?Isometric ER 10x ? ? ? ?01/11/22 ? ? ?3 way endurance holds YTB 4x 10s holds in each position ?Wall ball CW and CCW 2x20 each ?UBE L3 4 min fwd and retro (2 min in each direction, alternating) ?90/90 ER and IR RTB 2x10 (rest pause required to finish 2nd set) ? ?12/24/21 ? ?Manual: inf and post L GHJ mob grade III ? ?UBE L2 4  min retro and fwd ?Rhtymic stab 30s 4x ?Manual PNF D2 3x10 ?Wall ball CW and CCW 2x20 each ?  Face pull cable 2x10 10lbs ?90/90 YTB 2x10 (  rest pause required to finish 2nd set) ?Arnold press 2lbs 2x10 ? ?12/21 ? ?UBE L2 4  min retro and fwd ?Rhtymic stab 30s 4x ?Manual PNF D2 3x10 ?Wall ball CW and CCW 2x20 each ?  Prone rowing 5lbs 3x10 ?90/90 YTB 2x10 (rest pause required to finish 2nd set) ?Arnold press 3lbs 10x ? ?12/14 ? ?Manual: inf and post L GHJ grade III ? ?UBE L2 4  min retro and fwd ?Finger ladder 5x flexion and ABD ?Manual PNF D2 3x10 ?  Prone rowing 5lbs 2x10 ?90/90 YTB 2x10 ? ?12/8 ? ?Joint mob: L GHJ inf and post mob grade III ? ?Manual PNF D2 3x10 ?  3lb scaption 3x10 ?  3lb OHP 2x10 ?  Single arm rowing GTB 3x10 ?Counter push up 2x10 ?Trialed kneeling pushup-  too difficult ? ? ?_________________________________________ ?  ?PATIENT EDUCATION: ?Education details: anatomy, exercise progression, joint protection, thermotherapy, DOMS expectations, muscle firing,  envelope of function, HEP, POC ?Person educated: Patient ?Education method: Explanation, Demonstration, Tactile cues, Verbal cues, and Handouts ?Education comprehension: verbalized understanding, returned demonstration, verbal cues required, and tactile cues required ?  ?  ?HOME EXERCISE PROGRAM: ?Access Code: [email or web address] ?URL: [email or web address] ?Date: 03/26/2022 ?Prepared by: Daleen Bo ? ?Exercises ?- Standing Shoulder Abduction AAROM with Dowel  - 1 x daily - 7 x weekly - 2 sets - 10 reps ?- Standing Shoulder Row with Anchored Resistance  - 1 x daily - 3 x weekly - 2 sets - 10 reps ?- Shoulder External Rotation and Scapular Retraction with Resistance  - 1 x daily - 3 x weekly - 2 sets - 10 reps ?- Standing Shoulder Diagonal Horizontal Abduction 60/120 Degrees with Resistance  - 1 x daily - 3 x weekly - 1 sets - 5 reps ? ?Patient Education ?- Trigger Point Dry Needling ?  ?  ?ASSESSMENT: ?  ?CLINICAL IMPRESSION: ?Pt with signficant improvement in OH ROM today with A/AROM. Pt able to reach greater than 120 deg with AROM and 130+ with AAROM along the wall. Pt also able to increase loading at the house with biceps curls as well  L RTC direct loading. Pt progressing well with ROM but still with pain with OH and heavy lifting as expected. Pt with strength and endurance deficits but much better managed pain. Plan to continue with strength and OH ROM as able. ? ? ?Objective impairments include decreased knowledge of condition, decreased ROM, decreased strength, hypomobility, increased muscle spasms, impaired flexibility, impaired UE functional use, improper body mechanics, postural dysfunction, and pain. These impairments are limiting patient from cleaning, community activity, driving, occupation, yard work,  and shopping. Personal factors including Behavior pattern, Past/current experiences, Profession, and 1 comorbidity:    are also affecting patient's functional outcome. Patient will benefit from skilled PT to address above impairments and improve overall function. ?  ?REHAB POTENTIAL: Fair   ?  ?CLINICAL DECISION MAKING: Stable/uncomplicated ?  ?EVALUATION COMPLEXITY: Low ?  ?  ?GOALS: ?  ?  ?SHORT TERM GOALS: ?  ?STG Name Target Date Goal status  ?1 Pt will become independent with HEP in order to demonstrate synthesis of PT education. ?  10/27/2021 MET  ?2 Pt will be able to demonstrate full OH reaching ROM in order to demonstrate functional improvement in UE function for self-care and house hold duties.  ?  11/10/2021 MET  ?3 Pt will report at least 2 pt reduction on NPRS scale for pain in order to demonstrate functional improvement with household activity, self  care, and ADL. ?  ?  11/10/2021 MET  ?  ?LONG TERM GOALS:  ?  ?LTG Name Target Date Goal status  ?1 Pt  will become independent with final HEP in order to demonstrate synthesis of PT education. ?  11/24/2021 Ongoing  ?2 Pt will be able to demonstrate within 75-80% strength with dynamometer MMT of R UE in order to demonstrate functional improvement in L shoulder strength and function. ?  ?  12/08/2021 Ongoing  ?3 Pt will be able to reach [facility] and carry/hold >5 lbs in order to demonstrate functional improvement in L UE strength for return to PLOF.  ?  12/08/2021 Ongoing  ?4 Pt will score >/= 68 on FOTO to demonstrate functional improvement in L shoulder function. ?  ?  12/08/2021 Ongoing  ?  ?PLAN: ?PT FREQUENCY: 1-2x/week ?  ?PT DURATION: 8 weeks ?  ?PLANNED INTERVENTIONS: Therapeutic exercises, Therapeutic activity, Neuro Muscular re-education, Balance training, Gait training, Patient/Family education, Joint mobilization, Vestibular training, Aquatic Therapy, Dry Needling, Electrical stimulation, Spinal mobilization, Cryotherapy, Moist heat, scar mobilization,  Taping, Vasopneumatic device, Traction, Ultrasound, Ionotophoresis 4mg /ml Dexamethasone, and Manual therapy ?  ?PLAN FOR NEXT SESSION: review HEP, improve ROM, joint mobs, STM ? ?Daleen Bo PT, DPT ?

## 2022-05-10 ENCOUNTER — Ambulatory Visit (HOSPITAL_BASED_OUTPATIENT_CLINIC_OR_DEPARTMENT_OTHER): Payer: Federal, State, Local not specified - PPO | Attending: Orthopaedic Surgery | Admitting: Physical Therapy

## 2022-05-10 ENCOUNTER — Encounter (HOSPITAL_BASED_OUTPATIENT_CLINIC_OR_DEPARTMENT_OTHER): Payer: Self-pay | Admitting: Physical Therapy

## 2022-05-10 DIAGNOSIS — M6281 Muscle weakness (generalized): Secondary | ICD-10-CM | POA: Insufficient documentation

## 2022-05-10 DIAGNOSIS — M25512 Pain in left shoulder: Secondary | ICD-10-CM | POA: Diagnosis present

## 2022-05-10 DIAGNOSIS — G8929 Other chronic pain: Secondary | ICD-10-CM | POA: Insufficient documentation

## 2022-05-10 DIAGNOSIS — M25612 Stiffness of left shoulder, not elsewhere classified: Secondary | ICD-10-CM | POA: Insufficient documentation

## 2022-05-10 NOTE — Therapy (Signed)
OUTPATIENT PHYSICAL THERAPY RE-CERTIFICATION NOTE   Patient Name: Cutter Passey MRN: [id number] DOB:1976-05-31, 46 y.o., male Today's Date: 05/10/2022  PCP: Wendie Agreste, MD REFERRING PROVIDER: Wendie Agreste, MD   PT End of Session - 05/10/22 1350     Visit Number 17    Number of Visits 30    Date for PT Re-Evaluation 07/09/22    Authorization Type BCBS    PT Start Time 1346    PT Stop Time 1426    PT Time Calculation (min) 40 min    Activity Tolerance Patient tolerated treatment well;Patient limited by pain    Behavior During Therapy [facility] for tasks assessed/performed                     History reviewed. No pertinent past medical history. Past Surgical History:  Procedure Laterality Date   HERNIA REPAIR     Patient Active Problem List   Diagnosis Date Noted   Rotator cuff tear 01/26/2022     REFERRING DIAG: M25.512 (ICD-10-CM) - Left shoulder pain, unspecified chronicity   Left RC partial tear. 3-4 weeks conservative management   THERAPY DIAG:  Muscle weakness (generalized)  Chronic left shoulder pain  Stiffness of left shoulder, not elsewhere classified  PERTINENT HISTORY: Post-concussion  PRECAUTIONS: N/A  SUBJECTIVE:  Pt states the shoulder feels better. He has less instances of pain at rest but is still unable to do much OH. He is pleased with having less pain at rest.  PERTINENT HISTORY: Post-concussion   PAIN:  Are you having pain? No NPRS scale 0/10 Pain location: L anterior shoulder and posterior, anterior  Pain orientation: Left  PAIN TYPE: sharp, hot Aggravating factors: lifting heavy, going OH Relieving factors: ice, meds, using R arm    OBJECTIVE:   DIAGNOSTIC FINDINGS:  MRI L shoulder IMPRESSION: 1. Two focal near full-thickness rotator cuff tears involving the mid supraspinatus tendon and supraspinatus-infraspinatus interdigitation. 2. Mild intra-articular biceps tendinosis without tear. 3. Findings suspicious  for a posterosuperior labral tear. 4. Mild subacromial-subdeltoid bursitis.   PATIENT SURVEYS:  FOTO 43- Eval D/C 68 FOTO 8th visit- 56.9 FOTO 10th Visit-  63.7 FOTO at re-cert 24 FOTO 8/58 57   UPPER EXTREMITY AROM/PROM:   A/PROM Right   Left    Shoulder flexion WFL  throughout 145 p!  Shoulder extension   43  Shoulder abduction   125  Shoulder ER   T2  Shoulder internal rotation   T7  (Blank rows = not tested)   UPPER EXTREMITY MMT: measured in lbs   MMT Right   Left   Left 1/26 5/22  Shoulder flexion   16.1 17.4 19.9  Shoulder extension   18 18.5 18.4  Shoulder abduction   16.1 21.8 22.6  Shoulder ER   18.4 18.4 19.2  Shoulder IR   17.8 19.5 21.8  (Blank rows = not tested)    TODAY'S TREATMENT: 5/22  UBE L2 40min and retro  1lb arnold press 3x10 1lb scaption 2x10 1lb ABD 2x10 15lb farmer carry 53ft x2  Edu about examin finding, home exercise, functional limits with RC tear, OH stability  4/26  STM: posterior L shoulder- infra, supra, mid trap Joint mob: L inf GHJ grade II-III  Biceps 5lbs 2x10 Tricep kickback 5lbs 2x10 GTB ext 2x10 RTB ER 2x10 RTB diagonals 2x5 Wall slide 2x10 flexion and ABD  4/7  STM: posterior L shoulder- infra, supra, mid trap Joint mob: L inf GHJ grade II-III  Kasandra Knudsen  ABD 3s 10x Rowing 2x10 GTB GTB ext 2x10 YTB ER 2x10 YTB diagonals 2x5      3/14  STM: posterior L shoulder- infra, supra, mid trap Joint mob: L inf GHJ grade II-III  Tennis ball self massage Table flexion 3s 10x Table ABD 3s 10x Scap squeeze 20x Isometric ER 10x    01/11/22   3 way endurance holds YTB 4x 10s holds in each position Wall ball CW and CCW 2x20 each UBE L3 4 min fwd and retro (2 min in each direction, alternating) 90/90 ER and IR RTB 2x10 (rest pause required to finish 2nd set)  12/24/21  Manual: inf and post L GHJ mob grade III  UBE L2 4  min retro and fwd Rhtymic stab 30s 4x Manual PNF D2 3x10 Wall ball CW and CCW  2x20 each   Face pull cable 2x10 10lbs 90/90 YTB 2x10 (rest pause required to finish 2nd set) [name] press 2lbs 2x10  12/21  UBE L2 4  min retro and fwd Rhtymic stab 30s 4x Manual PNF D2 3x10 Wall ball CW and CCW 2x20 each   Prone rowing 5lbs 3x10 90/90 YTB 2x10 (rest pause required to finish 2nd set) [name] press 3lbs 10x  12/14  Manual: inf and post L GHJ grade III  UBE L2 4  min retro and fwd Finger ladder 5x flexion and ABD Manual PNF D2 3x10   Prone rowing 5lbs 2x10 90/90 YTB 2x10  12/8  Joint mob: L GHJ inf and post mob grade III  Manual PNF D2 3x10   3lb scaption 3x10   3lb OHP 2x10   Single arm rowing GTB 3x10 Counter push up 2x10 Trialed kneeling pushup- too difficult   _________________________________________   PATIENT EDUCATION: Education details: anatomy, exercise progression, joint protection, thermotherapy, DOMS expectations, muscle firing,  envelope of function, HEP, POC Person educated: Patient Education method: Explanation, Demonstration, Tactile cues, Verbal cues, and Handouts Education comprehension: verbalized understanding, returned demonstration, verbal cues required, and tactile cues required     HOME EXERCISE PROGRAM: Access Code: [email or web address] URL: [email or web address] Date: 03/26/2022 Prepared by: Daleen Bo  Exercises - Standing Shoulder Abduction AAROM with Dowel  - 1 x daily - 7 x weekly - 2 sets - 10 reps - Standing Shoulder Row with Anchored Resistance  - 1 x daily - 3 x weekly - 2 sets - 10 reps - Shoulder External Rotation and Scapular Retraction with Resistance  - 1 x daily - 3 x weekly - 2 sets - 10 reps - Standing Shoulder Diagonal Horizontal Abduction 60/120 Degrees with Resistance  - 1 x daily - 3 x weekly - 1 sets - 5 reps  Patient Education - Trigger Point Dry Needling     ASSESSMENT:   CLINICAL IMPRESSION: Pt continues to make improvement with  functional strength measures but no significant change in  ROM. Pt does have improvement with self reported outcome measure as well with report of decreased pain with ADL. Pt able to introduce [place] long lever strengthening at the shoulder as well as funcitonal carrying tasks without pain. HEP updated as well. Plan to continue at decreased frequency in order to progress functional L UE strengthening and HEP.   Objective impairments include decreased knowledge of condition, decreased ROM, decreased strength, hypomobility, increased muscle spasms, impaired flexibility, impaired UE functional use, improper body mechanics, postural dysfunction, and pain. These impairments are limiting patient from cleaning, community activity, driving, occupation, yard work, and shopping. Personal factors including Behavior pattern, Past/current experiences,  Profession, and 1 comorbidity:    are also affecting patient's functional outcome. Patient will benefit from skilled PT to address above impairments and improve overall function.   REHAB POTENTIAL: Fair     CLINICAL DECISION MAKING: Stable/uncomplicated   EVALUATION COMPLEXITY: Low     GOALS:     SHORT TERM GOALS:   STG Name Target Date Goal status  1 Pt will become independent with HEP in order to demonstrate synthesis of PT education.   10/27/2021 MET  2 Pt will be able to demonstrate full OH reaching ROM in order to demonstrate functional improvement in UE function for self-care and house hold duties.    11/10/2021 MET  3 Pt will report at least 2 pt reduction on NPRS scale for pain in order to demonstrate functional improvement with household activity, self care, and ADL.     11/10/2021 MET    LONG TERM GOALS:    LTG Name Target Date Goal status  1 Pt  will become independent with final HEP in order to demonstrate synthesis of PT education.   07/09/22 Ongoing  2 Pt will be able to demonstrate within 65% strength with dynamometer MMT of R UE in order to demonstrate functional improvement in L shoulder strength  and function.     07/09/22 Ongoing  3 Pt will be able to reach [facility] and carry/hold >5 lbs in order to demonstrate functional improvement in L UE strength for return to PLOF.    07/09/22 Partially met  4 Pt will score >/= 68 on FOTO to demonstrate functional improvement in L shoulder function.     07/09/22 Partially met    PLAN: PT FREQUENCY: 1-2x/week   PT DURATION: 8 weeks   PLANNED INTERVENTIONS: Therapeutic exercises, Therapeutic activity, Neuro Muscular re-education, Balance training, Gait training, Patient/Family education, Joint mobilization, Vestibular training, Aquatic Therapy, Dry Needling, Electrical stimulation, Spinal mobilization, Cryotherapy, Moist heat, scar mobilization, Taping, Vasopneumatic device, Traction, Ultrasound, Ionotophoresis 4mg /ml Dexamethasone, and Manual therapy   PLAN FOR NEXT SESSION: review HEP, CKC stability, review suitcase carry, OH wall ball, consider DN PRN  Daleen Bo PT, DPT 05/10/22 2:41 PM
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
## 2022-05-24 ENCOUNTER — Encounter: Payer: Self-pay | Admitting: Neurology

## 2022-05-24 ENCOUNTER — Ambulatory Visit (INDEPENDENT_AMBULATORY_CARE_PROVIDER_SITE_OTHER): Payer: Federal, State, Local not specified - PPO | Admitting: Neurology

## 2022-05-24 VITALS — BP 121/84 | HR 78 | Ht 76.0 in | Wt 195.6 lb

## 2022-05-24 DIAGNOSIS — H8110 Benign paroxysmal vertigo, unspecified ear: Secondary | ICD-10-CM

## 2022-05-24 DIAGNOSIS — G44329 Chronic post-traumatic headache, not intractable: Secondary | ICD-10-CM

## 2022-05-26 ENCOUNTER — Ambulatory Visit (HOSPITAL_BASED_OUTPATIENT_CLINIC_OR_DEPARTMENT_OTHER): Payer: Federal, State, Local not specified - PPO | Attending: Orthopaedic Surgery | Admitting: Physical Therapy

## 2022-05-26 ENCOUNTER — Encounter (HOSPITAL_BASED_OUTPATIENT_CLINIC_OR_DEPARTMENT_OTHER): Payer: Self-pay | Admitting: Physical Therapy

## 2022-05-26 DIAGNOSIS — M25612 Stiffness of left shoulder, not elsewhere classified: Secondary | ICD-10-CM | POA: Diagnosis present

## 2022-05-26 DIAGNOSIS — M25512 Pain in left shoulder: Secondary | ICD-10-CM | POA: Insufficient documentation

## 2022-05-26 DIAGNOSIS — G8929 Other chronic pain: Secondary | ICD-10-CM | POA: Diagnosis present

## 2022-05-26 DIAGNOSIS — M6281 Muscle weakness (generalized): Secondary | ICD-10-CM | POA: Diagnosis present

## 2022-05-26 NOTE — Therapy (Addendum)
OUTPATIENT PHYSICAL THERAPY TREATMENT NOTE   Patient Name: Sergio Hernandez MRN: [id number] DOB:07/01/1976, 46 y.o., male Today's Date: 05/26/2022  PCP: Wendie Agreste, MD REFERRING PROVIDER: Wendie Agreste, MD   PT End of Session - 05/26/22 1103     Visit Number 18    Number of Visits 30    Date for PT Re-Evaluation 07/09/22    Authorization Type BCBS    PT Start Time 1100    PT Stop Time 1140    PT Time Calculation (min) 40 min    Activity Tolerance Patient tolerated treatment well;Patient limited by pain    Behavior During Therapy [facility] for tasks assessed/performed                     History reviewed. No pertinent past medical history. Past Surgical History:  Procedure Laterality Date   HERNIA REPAIR     Patient Active Problem List   Diagnosis Date Noted   Rotator cuff tear 01/26/2022     REFERRING DIAG: M25.512 (ICD-10-CM) - Left shoulder pain, unspecified chronicity   Left RC partial tear. 3-4 weeks conservative management   THERAPY DIAG:  Muscle weakness (generalized)  Chronic left shoulder pain  Stiffness of left shoulder, not elsewhere classified  PERTINENT HISTORY: Post-concussion  PRECAUTIONS: N/A  SUBJECTIVE:  Pt states the shoulder feels better. Pt is still unable to reach [facility] but continues to have less/no pain at rest. He feels that the cold appears to bother the shoulder. Pt states he is sore in the shoulder today.   PERTINENT HISTORY: Post-concussion   PAIN:  Are you having pain? No NPRS scale 3/10 Pain location: L anterior shoulder and posterior, anterior  Pain orientation: Left  PAIN TYPE: sharp, hot Aggravating factors: lifting heavy, going OH Relieving factors: ice, meds, using R arm    OBJECTIVE:   DIAGNOSTIC FINDINGS:  MRI L shoulder IMPRESSION: 1. Two focal near full-thickness rotator cuff tears involving the mid supraspinatus tendon and supraspinatus-infraspinatus interdigitation. 2. Mild intra-articular biceps  tendinosis without tear. 3. Findings suspicious for a posterosuperior labral tear. 4. Mild subacromial-subdeltoid bursitis.   PATIENT SURVEYS:  FOTO 43- Eval D/C 68 FOTO 8th visit- 56.9 FOTO 10th Visit-  02.5 FOTO at re-cert 79 FOTO 8/52 57   UPPER EXTREMITY AROM/PROM:   A/PROM Right   Left    Shoulder flexion WFL  throughout 145 p!  Shoulder extension   43  Shoulder abduction   125  Shoulder ER   T2  Shoulder internal rotation   T7  (Blank rows = not tested)   UPPER EXTREMITY MMT: measured in lbs   MMT Right   Left   Left 1/26 5/22  Shoulder flexion   16.1 17.4 19.9  Shoulder extension   18 18.5 18.4  Shoulder abduction   16.1 21.8 22.6  Shoulder ER   18.4 18.4 19.2  Shoulder IR   17.8 19.5 21.8  (Blank rows = not tested)    TODAY'S TREATMENT:  6/7  UBE L2 2 min and retro  STM: posterior L shoulder- infra, supra, mid trap Joint mob: L inf GHJ grade II-III   5/22  UBE L2 62mn and retro  1lb arnold press 3x10 1lb scaption 2x10 1lb ABD 2x10 15lb farmer carry [id number]   4/26  STM: posterior L shoulder- infra, supra, mid trap Joint mob: L inf GHJ grade II-III  Biceps 5lbs 2x10 Tricep kickback 5lbs 2x10 GTB ext 2x10 RTB ER 2x10 RTB diagonals 2x5 Wall slide  2x10 flexion and ABD  4/7  STM: posterior L shoulder- infra, supra, mid trap Joint mob: L inf GHJ grade II-III  Cane ABD 3s 10x Rowing 2x10 GTB GTB ext 2x10 YTB ER 2x10 YTB diagonals 2x5      3/14  STM: posterior L shoulder- infra, supra, mid trap Joint mob: L inf GHJ grade II-III  Tennis ball self massage Table flexion 3s 10x Table ABD 3s 10x Scap squeeze 20x Isometric ER 10x    01/11/22   3 way endurance holds YTB 4x 10s holds in each position Wall ball CW and CCW 2x20 each UBE L3 4 min fwd and retro (2 min in each direction, alternating) 90/90 ER and IR RTB 2x10 (rest pause required to finish 2nd set)  12/24/21  Manual: inf and post L GHJ mob grade III  UBE L2  4  min retro and fwd Rhtymic stab 30s 4x Manual PNF D2 3x10 Wall ball CW and CCW 2x20 each   Face pull cable 2x10 10lbs 90/90 YTB 2x10 (rest pause required to finish 2nd set) Arnold press 2lbs 2x10  _________________________________________   PATIENT EDUCATION: Education details: anatomy, exercise progression, joint protection, thermotherapy, DOMS expectations, muscle firing,  envelope of function, HEP, POC Person educated: Patient Education method: Explanation, Demonstration, Tactile cues, Verbal cues, and Handouts Education comprehension: verbalized understanding, returned demonstration, verbal cues required, and tactile cues required     HOME EXERCISE PROGRAM: Access Code: [email or web address] URL: [email or web address] Date: 03/26/2022 Prepared by: Daleen Bo  Exercises - Standing Shoulder Abduction AAROM with Dowel  - 1 x daily - 7 x weekly - 2 sets - 10 reps - Standing Shoulder Row with Anchored Resistance  - 1 x daily - 3 x weekly - 2 sets - 10 reps - Shoulder External Rotation and Scapular Retraction with Resistance  - 1 x daily - 3 x weekly - 2 sets - 10 reps - Standing Shoulder Diagonal Horizontal Abduction 60/120 Degrees with Resistance  - 1 x daily - 3 x weekly - 1 sets - 5 reps  Patient Education - Trigger Point Dry Needling     ASSESSMENT:   CLINICAL IMPRESSION: Pt presents today with soreness about the L shoulder with flexion and ABD. Pt had significant improved gain in ROM following manual therapy. Pt had increased L GHJ stiffness as well as soft tissue restriction that was limited OH ROM. Likely caused due to increased ADL and daily lifting demands. Pt advised to continue with stretching and strengthening HEP in ordre to maintain gained ROM. HEP updated with greater resistance bands but no no change in exercise. Plan to continue at decreased frequency in order to progress functional L UE strengthening and HEP.   Objective impairments include decreased  knowledge of condition, decreased ROM, decreased strength, hypomobility, increased muscle spasms, impaired flexibility, impaired UE functional use, improper body mechanics, postural dysfunction, and pain. These impairments are limiting patient from cleaning, community activity, driving, occupation, yard work, and shopping. Personal factors including Behavior pattern, Past/current experiences, Profession, and 1 comorbidity:    are also affecting patient's functional outcome. Patient will benefit from skilled PT to address above impairments and improve overall function.   REHAB POTENTIAL: Fair     CLINICAL DECISION MAKING: Stable/uncomplicated   EVALUATION COMPLEXITY: Low     GOALS:     SHORT TERM GOALS:   STG Name Target Date Goal status  1 Pt will become independent with HEP in order to demonstrate synthesis of PT education.   10/27/2021 MET  2 Pt will be able to demonstrate full OH reaching ROM in order to demonstrate functional improvement in UE function for self-care and house hold duties.    11/10/2021 MET  3 Pt will report at least 2 pt reduction on NPRS scale for pain in order to demonstrate functional improvement with household activity, self care, and ADL.     11/10/2021 MET    LONG TERM GOALS:    LTG Name Target Date Goal status  1 Pt  will become independent with final HEP in order to demonstrate synthesis of PT education.   07/09/22 Ongoing  2 Pt will be able to demonstrate within 65% strength with dynamometer MMT of R UE in order to demonstrate functional improvement in L shoulder strength and function.     07/09/22 Ongoing  3 Pt will be able to reach [facility] and carry/hold >5 lbs in order to demonstrate functional improvement in L UE strength for return to PLOF.    07/09/22 Partially met  4 Pt will score >/= 68 on FOTO to demonstrate functional improvement in L shoulder function.     07/09/22 Partially met    PLAN: PT FREQUENCY: 1-2x/week   PT DURATION: 8 weeks    PLANNED INTERVENTIONS: Therapeutic exercises, Therapeutic activity, Neuro Muscular re-education, Balance training, Gait training, Patient/Family education, Joint mobilization, Vestibular training, Aquatic Therapy, Dry Needling, Electrical stimulation, Spinal mobilization, Cryotherapy, Moist heat, scar mobilization, Taping, Vasopneumatic device, Traction, Ultrasound, Ionotophoresis 40m/ml Dexamethasone, and Manual therapy   PLAN FOR NEXT SESSION: review HEP, CKC stability, review suitcase carry, OH wall ball, consider DN PRN  ADaleen BoPT, DPT 05/26/22 11:46 AM

## 2022-06-02 ENCOUNTER — Ambulatory Visit (HOSPITAL_BASED_OUTPATIENT_CLINIC_OR_DEPARTMENT_OTHER): Payer: Federal, State, Local not specified - PPO | Admitting: Physical Therapy

## 2022-06-24 ENCOUNTER — Ambulatory Visit (HOSPITAL_BASED_OUTPATIENT_CLINIC_OR_DEPARTMENT_OTHER): Payer: Federal, State, Local not specified - PPO | Attending: Orthopaedic Surgery | Admitting: Physical Therapy

## 2022-06-24 ENCOUNTER — Encounter (HOSPITAL_BASED_OUTPATIENT_CLINIC_OR_DEPARTMENT_OTHER): Payer: Self-pay | Admitting: Physical Therapy

## 2022-06-24 DIAGNOSIS — M25512 Pain in left shoulder: Secondary | ICD-10-CM | POA: Diagnosis present

## 2022-06-24 DIAGNOSIS — M25612 Stiffness of left shoulder, not elsewhere classified: Secondary | ICD-10-CM | POA: Insufficient documentation

## 2022-06-24 DIAGNOSIS — M6281 Muscle weakness (generalized): Secondary | ICD-10-CM | POA: Insufficient documentation

## 2022-06-24 DIAGNOSIS — G8929 Other chronic pain: Secondary | ICD-10-CM | POA: Insufficient documentation

## 2022-06-24 NOTE — Therapy (Addendum)
OUTPATIENT PHYSICAL THERAPY TREATMENT NOTE  PHYSICAL THERAPY DISCHARGE SUMMARY  Visits from Start of Care: 19  Plan: Patient agrees to discharge.  Patient goals were not met. Patient is being discharged due to not returning to therapy and reaching functional plateau.       Patient Name: Christo Hain MRN: [id number] DOB:[birth year], 46 y.o., male Today's Date: 06/24/2022  PCP: Wendie Agreste, MD REFERRING PROVIDER: Wendie Agreste, MD   PT End of Session - 06/24/22 1410     Visit Number 19    Number of Visits 30    Date for PT Re-Evaluation 07/09/22    Authorization Type BCBS    PT Start Time 1415    PT Stop Time 1455    PT Time Calculation (min) 40 min    Activity Tolerance Patient tolerated treatment well;Patient limited by pain    Behavior During Therapy [facility] for tasks assessed/performed                      History reviewed. No pertinent past medical history. Past Surgical History:  Procedure Laterality Date   HERNIA REPAIR     Patient Active Problem List   Diagnosis Date Noted   Rotator cuff tear 01/26/2022     REFERRING DIAG: M25.512 (ICD-10-CM) - Left shoulder pain, unspecified chronicity   Left RC partial tear. 3-4 weeks conservative management   THERAPY DIAG:  Muscle weakness (generalized)  Chronic left shoulder pain  Stiffness of left shoulder, not elsewhere classified  PERTINENT HISTORY: Post-concussion  PRECAUTIONS: N/A  SUBJECTIVE:  Pt states the shoulder feels better. Pt is still unable to reach [facility] but continues to have less/no pain at rest. He has been doing HEP frequently and notices the stretching helps. He is stronger with pulling and pushing around waist height. 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   PERTINENT HISTORY: Post-concussion   PAIN:  Are you having pain? No NPRS scale 3/10 Pain location: L anterior shoulder and posterior,  anterior  Pain orientation: Left  PAIN TYPE: sharp, hot Aggravating factors: lifting heavy, going OH Relieving factors: ice, meds, using R arm    OBJECTIVE:   DIAGNOSTIC FINDINGS:  MRI L shoulder IMPRESSION: 1. Two focal near full-thickness rotator cuff tears involving the mid supraspinatus tendon and supraspinatus-infraspinatus interdigitation. 2. Mild intra-articular biceps tendinosis without tear. 3. Findings suspicious for a posterosuperior labral tear. 4. Mild subacromial-subdeltoid bursitis.   PATIENT SURVEYS:  FOTO 43- Eval D/C 68 FOTO 8th visit- 56.9 FOTO 10th Visit-  36.6 FOTO at re-cert 20 FOTO 4/40 57   UPPER EXTREMITY AROM/PROM:   A/PROM Right   Left    Shoulder flexion WFL  throughout 145 p!  Shoulder extension   43  Shoulder abduction   125  Shoulder ER   T2  Shoulder internal rotation   T7  (Blank rows = not tested)   UPPER EXTREMITY MMT: measured in lbs   MMT Right   Left   Left 1/26 5/22  Shoulder flexion   16.1 17.4 19.9  Shoulder extension   18 18.5 18.4  Shoulder abduction   16.1 21.8 22.6  Shoulder ER   18.4 18.4 19.2  Shoulder IR   17.8 19.5 21.8  (Blank rows = not tested)    TODAY'S TREATMENT:  7/6   STM: posterior L shoulder- deltoid, infra, supra Joint mob: L inf GHJ grade II-III  Biceps curl 7lbs Tricep extension 7lbs Self massage technique tennis ball/lax ball  Exercise taper, functional capacity limits, safety, HEP progression/regression   6/7  UBE L2 2 min and retro  STM: posterior L shoulder- infra, supra, mid trap Joint mob: L inf GHJ grade II-III   5/22  UBE L2 39mn and retro  1lb arnold press 3x10 1lb scaption 2x10 1lb ABD 2x10 15lb farmer carry [id number]   4/26  STM: posterior L shoulder- infra, supra, mid trap Joint mob: L inf GHJ grade II-III  Biceps 5lbs 2x10 Tricep kickback 5lbs 2x10 GTB ext 2x10 RTB ER 2x10 RTB diagonals 2x5 Wall slide 2x10 flexion and ABD  4/7  STM: posterior L  shoulder- infra, supra, mid trap Joint mob: L inf GHJ grade II-III  Cane ABD 3s 10x Rowing 2x10 GTB GTB ext 2x10 YTB ER 2x10 YTB diagonals 2x5      3/14  STM: posterior L shoulder- infra, supra, mid trap Joint mob: L inf GHJ grade II-III  Tennis ball self massage Table flexion 3s 10x Table ABD 3s 10x Scap squeeze 20x Isometric ER 10x    01/11/22   3 way endurance holds YTB 4x 10s holds in each position Wall ball CW and CCW 2x20 each UBE L3 4 min fwd and retro (2 min in each direction, alternating) 90/90 ER and IR RTB 2x10 (rest pause required to finish 2nd set)  12/24/21  Manual: inf and post L GHJ mob grade III  UBE L2 4  min retro and fwd Rhtymic stab 30s 4x Manual PNF D2 3x10 Wall ball CW and CCW 2x20 each   Face pull cable 2x10 10lbs 90/90 YTB 2x10 (rest pause required to finish 2nd set) Arnold press 2lbs 2x10  _________________________________________   PATIENT EDUCATION: Education details: anatomy, exercise progression, joint protection, thermotherapy, DOMS expectations, muscle firing,  envelope of function, HEP, POC Person educated: Patient Education method: Explanation, Demonstration, Tactile cues, Verbal cues, and Handouts Education comprehension: verbalized understanding, returned demonstration, verbal cues required, and tactile cues required     HOME EXERCISE PROGRAM: Access Code: [email or web address]: [email or web address] Date: 03/26/2022 Prepared by: AlDaleen BoExercises - Standing Shoulder Abduction AAROM with Dowel  - 1 x daily - 7 x weekly - 2 sets - 10 reps - Standing Shoulder Row with Anchored Resistance  - 1 x daily - 3 x weekly - 2 sets - 10 reps - Shoulder External Rotation and Scapular Retraction with Resistance  - 1 x daily - 3 x weekly - 2 sets - 10 reps - Standing Shoulder Diagonal Horizontal Abduction 60/120 Degrees with Resistance  - 1 x daily - 3 x weekly - 1 sets - 5 reps  Patient Education - Trigger Point Dry  Needling     ASSESSMENT:   CLINICAL IMPRESSION: Pt presents with increased L deltoid hypertonicity today that may be related to increased frequency of HEP. Pt reports daily strengthening exercises. Pt did have improvement with pain following STM but ultimately, lifting difficulty appears related to RC tear and lack of cuff strength with motions higher than shoulder level. Pt appears to reaching functional plateau with rehab. Pt was able to add in biceps and triceps strengthening exercise to aid in performance of ADL and work related duties. Pt given updated HEP and to schedule PRN for progression of L shoulder strength. Pt is still limited with OH motions due to pain within the shoulder. Pt would like to forgo trying TPDN at this time for pain management.  Objective impairments include decreased knowledge of condition, decreased ROM, decreased strength, hypomobility, increased muscle spasms, impaired flexibility, impaired UE functional use, improper body mechanics, postural dysfunction, and pain. These impairments are limiting patient from cleaning, community activity, driving, occupation, yard work, and shopping. Personal factors including Behavior pattern, Past/current experiences, Profession, and 1 comorbidity:    are also affecting patient's functional outcome. Patient will benefit from skilled PT to address above impairments and improve overall function.   REHAB POTENTIAL: Fair     CLINICAL DECISION MAKING: Stable/uncomplicated   EVALUATION COMPLEXITY: Low     GOALS:     SHORT TERM GOALS:   STG Name Target Date Goal status  1 Pt will become independent with HEP in order to demonstrate synthesis of PT education.   10/27/2021 MET  2 Pt will be able to demonstrate full OH reaching ROM in order to demonstrate functional improvement in UE function for self-care and house hold duties.    11/10/2021 MET  3 Pt will report at least 2 pt reduction on NPRS scale for pain in order to demonstrate  functional improvement with household activity, self care, and ADL.     11/10/2021 MET    LONG TERM GOALS:    LTG Name Target Date Goal status  1 Pt  will become independent with final HEP in order to demonstrate synthesis of PT education.   07/09/22 Ongoing  2 Pt will be able to demonstrate within 65% strength with dynamometer MMT of R UE in order to demonstrate functional improvement in L shoulder strength and function.     07/09/22 Ongoing  3 Pt will be able to reach [facility] and carry/hold >5 lbs in order to demonstrate functional improvement in L UE strength for return to PLOF.    07/09/22 Partially met  4 Pt will score >/= 68 on FOTO to demonstrate functional improvement in L shoulder function.     07/09/22 Partially met    PLAN: PT FREQUENCY: 1-2x/week   PT DURATION: 8 weeks   PLANNED INTERVENTIONS: Therapeutic exercises, Therapeutic activity, Neuro Muscular re-education, Balance training, Gait training, Patient/Family education, Joint mobilization, Vestibular training, Aquatic Therapy, Dry Needling, Electrical stimulation, Spinal mobilization, Cryotherapy, Moist heat, scar mobilization, Taping, Vasopneumatic device, Traction, Ultrasound, Ionotophoresis 50m/ml Dexamethasone, and Manual therapy   PLAN FOR NEXT SESSION: review HEP, CKC stability, review suitcase carry, OH wall ball, consider DN PRN  ADaleen BoPT, DPT 06/24/22 3:09 PM

## 2022-07-12 ENCOUNTER — Ambulatory Visit (HOSPITAL_BASED_OUTPATIENT_CLINIC_OR_DEPARTMENT_OTHER): Payer: Federal, State, Local not specified - PPO | Admitting: Family Medicine

## 2022-07-21 ENCOUNTER — Encounter (HOSPITAL_BASED_OUTPATIENT_CLINIC_OR_DEPARTMENT_OTHER): Payer: Self-pay | Admitting: Family Medicine

## 2022-07-21 ENCOUNTER — Ambulatory Visit (INDEPENDENT_AMBULATORY_CARE_PROVIDER_SITE_OTHER): Payer: Federal, State, Local not specified - PPO | Admitting: Family Medicine

## 2022-07-21 VITALS — BP 110/88 | HR 75 | Ht 76.0 in | Wt 193.8 lb

## 2022-07-21 DIAGNOSIS — S46012D Strain of muscle(s) and tendon(s) of the rotator cuff of left shoulder, subsequent encounter: Secondary | ICD-10-CM | POA: Diagnosis not present

## 2022-07-21 NOTE — Assessment & Plan Note (Signed)
Patient presents for follow-up from PRP injection and physical therapy.  Overall, he feels that he is doing quite well.  Pain is much improved from before PRP injection and PT.  Last visit with physical therapy was a few weeks ago now.  Has been continuing with home exercises.  Still have some mild discomfort with certain activities, particularly with overhead lifting.  He generally is aware of what will aggravate his shoulder and has been limiting this. Given notable improvement, can continue with home exercises, conservative measures.  We will plan for follow-up as needed.  Did discuss that if symptoms do worsen in the future or if any new injury occurs, he can return to the office for further evaluation and recommendations.  Considerations would be for returning to physical therapy, could possibly consider PRP injection depending on clinical scenario

## 2022-07-21 NOTE — Progress Notes (Signed)
    Procedures performed today:    None.  Independent interpretation of notes and tests performed by another provider:   None.  Brief History, Exam, Impression, and Recommendations:    BP 110/88   Pulse 75   Ht 6\' 4"  (1.93 m)   Wt 193 lb 12.8 oz (87.9 kg)   SpO2 99%   BMI 23.59 kg/m   Rotator cuff tear Patient presents for follow-up from PRP injection and physical therapy.  Overall, he feels that he is doing quite well.  Pain is much improved from before PRP injection and PT.  Last visit with physical therapy was a few weeks ago now.  Has been continuing with home exercises.  Still have some mild discomfort with certain activities, particularly with overhead lifting.  He generally is aware of what will aggravate his shoulder and has been limiting this. Given notable improvement, can continue with home exercises, conservative measures.  We will plan for follow-up as needed.  Did discuss that if symptoms do worsen in the future or if any new injury occurs, he can return to the office for further evaluation and recommendations.  Considerations would be for returning to physical therapy, could possibly consider PRP injection depending on clinical scenario  Return if symptoms worsen or fail to improve.   ___________________________________________ Maanya Hippert de , MD, ABFM, [facility]

## 2022-07-21 NOTE — Patient Instructions (Signed)

## 2022-08-30 ENCOUNTER — Telehealth (HOSPITAL_BASED_OUTPATIENT_CLINIC_OR_DEPARTMENT_OTHER): Payer: Self-pay | Admitting: Family Medicine

## 2022-08-30 NOTE — Telephone Encounter (Signed)
Pt would like to know if you can give him a letter advising what his disability is or what he can and can't do , regarding his injury     Does he need an appt

## 2022-09-01 ENCOUNTER — Ambulatory Visit (INDEPENDENT_AMBULATORY_CARE_PROVIDER_SITE_OTHER): Payer: Federal, State, Local not specified - PPO | Admitting: Orthopaedic Surgery

## 2022-09-01 DIAGNOSIS — M75112 Incomplete rotator cuff tear or rupture of left shoulder, not specified as traumatic: Secondary | ICD-10-CM | POA: Diagnosis not present
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
## 2022-12-30 ENCOUNTER — Ambulatory Visit (INDEPENDENT_AMBULATORY_CARE_PROVIDER_SITE_OTHER): Payer: Federal, State, Local not specified - PPO | Admitting: Orthopaedic Surgery

## 2022-12-30 ENCOUNTER — Other Ambulatory Visit (HOSPITAL_BASED_OUTPATIENT_CLINIC_OR_DEPARTMENT_OTHER): Payer: Self-pay

## 2022-12-30 DIAGNOSIS — M5010 Cervical disc disorder with radiculopathy, unspecified cervical region: Secondary | ICD-10-CM

## 2022-12-30 MED ORDER — METHYLPREDNISOLONE 4 MG PO TBPK
ORAL_TABLET | ORAL | 0 refills | Status: DC
  Filled 2022-12-30: qty 21, 6d supply, fill #0
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
## 2023-03-10 ENCOUNTER — Ambulatory Visit (INDEPENDENT_AMBULATORY_CARE_PROVIDER_SITE_OTHER): Payer: Federal, State, Local not specified - PPO | Admitting: Student

## 2023-03-10 ENCOUNTER — Other Ambulatory Visit (HOSPITAL_BASED_OUTPATIENT_CLINIC_OR_DEPARTMENT_OTHER): Payer: Self-pay

## 2023-03-10 DIAGNOSIS — M75112 Incomplete rotator cuff tear or rupture of left shoulder, not specified as traumatic: Secondary | ICD-10-CM | POA: Diagnosis not present

## 2023-03-10 MED ORDER — MELOXICAM 15 MG PO TABS
15.0000 mg | ORAL_TABLET | Freq: Every day | ORAL | 0 refills | Status: DC
  Filled 2023-03-10: qty 30, 30d supply, fill #0

## 2023-03-10 MED ORDER — MELOXICAM 15 MG PO TABS: 15.0000 mg | ORAL_TABLET | Freq: Every day | ORAL | 0 refills | Status: DC

## 2023-03-10 NOTE — Progress Notes (Signed)
Chief Complaint: Left shoulder pain and weakness     History of Present Illness:    03/10/2023: Presents today for follow-up of the left shoulder pain that has been worse since yesterday.  He reports that he was lifting a pipe when he felt a sharp knife-like pain in his shoulder.  Since then his range of motion has decreased with continued pain.  He is having difficulty sleeping due to the pain.  He also has noticed more crepitus in the shoulder with movement.  He does have some radiating numbness and tingling down into his second and third digits, however this has been better since his last visit.  He has not been taking anything for pain. He is right-hand dominant.   Surgical History:   None  PMH/PSH/Family History/Social History/Meds/Allergies:   No past medical history on file. Past Surgical History:  Procedure Laterality Date   HERNIA REPAIR     Social History   Socioeconomic History   Marital status: Married    Spouse name: Not on file   Number of children: Not on file   Years of education: Not on file   Highest education level: Not on file  Occupational History   Not on file  Tobacco Use   Smoking status: Never   Smokeless tobacco: Never  Substance and Sexual Activity   Alcohol use: No   Drug use: Never   Sexual activity: Yes  Other Topics Concern   Not on file  Social History Narrative   Right handed   Social Determinants of Health   Financial Resource Strain: Not on file  Food Insecurity: Not on file  Transportation Needs: Not on file  Physical Activity: Not on file  Stress: Not on file  Social Connections: Not on file   Family History  Problem Relation Age of Onset   Diabetes Mother    Hyperlipidemia Father    Diabetes Father    No Known Allergies Current Outpatient Medications  Medication Sig Dispense Refill   meloxicam (MOBIC) 15 MG tablet Take 1 tablet (15 mg total) by mouth daily. 30 tablet 0    methylPREDNISolone (MEDROL DOSEPAK) 4 MG TBPK tablet Take per packet instructions 21 each 0   No current facility-administered medications for this visit.   No results found.  Review of Systems:   A ROS was performed including pertinent positives and negatives as documented in the HPI.  Physical Exam :   Constitutional: NAD and appears stated age Neurological: Alert and oriented Psych: Appropriate affect and cooperative There were no vitals taken for this visit.   Comprehensive Musculoskeletal Exam:    Musculoskeletal Exam    Inspection Right Left  Skin No atrophy or winging No atrophy or winging  Palpation    Tenderness None Lateral and anterior shoulder  Range of Motion    Flexion (passive) 170 130  Flexion (active) 170 110  Extension 30 30  Abduction 170 150  ER at side 45 45          Can reach behind back to L1 L5  Strength     Full Full  Special Tests    Pseudoparalytic No No  Neurologic    Fires PIN, radial, median, ulnar, musculocutaneous, axillary, suprascapular, long thoracic, and spinal accessory innervated muscles. No abnormal sensibility  Vascular/Lymphatic    Radial  Pulse 2+ 2+  Cervical Exam    Patient has symmetric cervical range of motion with negative Spurling's test.  Special Test:  Right-sided weakness with forward flexion and external rotation     Imaging:    I personally reviewed and interpreted the radiographs.   Assessment:   47 year old right-hand-dominant male with left shoulder pain and weakness after feeling sharp pain in his shoulder while lifting yesterday.  He has a known history of a partial thickness rotator cuff tear in this left shoulder, which I suspect is the source of his pain and weakness.  We have also previously discussed the possibility of pain and radicular symptoms coming from his neck, however these have not worsened since his last visit.  After discussion of different options, he still would not like to consider possible  surgical intervention and we therefore decided to hold off on an MRI until he feels necessary.  He has had some benefit in the past with ibuprofen, so we will trial pain control with Mobic and follow-up in 4 weeks to assess his symptoms.   Plan :    -Return to clinic in 4 weeks for reevaluation   I personally saw and evaluated the patient, and participated in the management and treatment plan.  Marnee Spring, PA-C Orthopedics  This document was dictated using Systems analyst. A reasonable attempt at proof reading has been made to minimize errors.

## 2023-03-17 ENCOUNTER — Encounter: Payer: Self-pay | Admitting: Family

## 2023-03-17 ENCOUNTER — Ambulatory Visit: Payer: Federal, State, Local not specified - PPO | Admitting: Family

## 2023-03-17 VITALS — BP 122/80 | HR 90 | Temp 98.4°F | Resp 17 | Ht 76.0 in | Wt 203.1 lb

## 2023-03-17 DIAGNOSIS — B354 Tinea corporis: Secondary | ICD-10-CM | POA: Diagnosis not present

## 2023-03-17 DIAGNOSIS — K921 Melena: Secondary | ICD-10-CM | POA: Diagnosis not present

## 2023-03-17 MED ORDER — CLOTRIMAZOLE-BETAMETHASONE 1-0.05 % EX CREA: 1.0000 | TOPICAL_CREAM | Freq: Every day | CUTANEOUS | 0 refills | Status: AC

## 2023-03-17 MED ORDER — FLUCONAZOLE 150 MG PO TABS: 150.0000 mg | ORAL_TABLET | ORAL | 0 refills | Status: DC

## 2023-03-17 NOTE — Progress Notes (Signed)
Acute Office Visit  Subjective:     Patient ID: Sergio Hernandez, male    DOB: [birth year], 47 y.o.   MRN: [id number]  Chief Complaint  Patient presents with  . Rash    Rash above eyes and on both legs Itches  No drainage    . Diarrhea    Pt states he has bloody stools sometimes      HPI Patient is in today with c/o a rash on his eyes, back, buttock, lower legs that he noticed after taking a Medrol DP. He reports the Medrol pack helped for his shoulder pain that was prescribed by ortho. He has been applying hydrocortisone cream to the areas that has helped but has not gotten rid of the rash completely. He has noticed it x 6 days and describes it as itchy.   Also reports having 3 episodes of blood in his stools over the last 3 months. The most recent episode was this week. Blood is bright red. He has seen GI in the past for an "abdominal blockage." Has not had a screening colonoscopy. Has a sister who has Crohn's Disease. Reports occasional constipation.   Review of Systems  Respiratory: Negative.    Cardiovascular: Negative.   Gastrointestinal:  Positive for blood in stool and constipation. Negative for abdominal pain.  Genitourinary: Negative.   Musculoskeletal: Negative.   Skin:  Positive for itching and rash.  Psychiatric/Behavioral: Negative.    All other systems reviewed and are negative. History reviewed. No pertinent past medical history.  Social History   Socioeconomic History  . Marital status: Married    Spouse name: Not on file  . Number of children: Not on file  . Years of education: Not on file  . Highest education level: Not on file  Occupational History  . Not on file  Tobacco Use  . Smoking status: Never  . Smokeless tobacco: Never  Substance and Sexual Activity  . Alcohol use: No  . Drug use: Never  . Sexual activity: Yes  Other Topics Concern  . Not on file  Social History Narrative   Right handed   Social Determinants of Health   Financial  Resource Strain: Not on file  Food Insecurity: Not on file  Transportation Needs: Not on file  Physical Activity: Not on file  Stress: Not on file  Social Connections: Not on file  Intimate Partner Violence: Not on file    Past Surgical History:  Procedure Laterality Date  . HERNIA REPAIR      Family History  Problem Relation Age of Onset  . Diabetes Mother   . Hyperlipidemia Father   . Diabetes Father     No Known Allergies  Current Outpatient Medications on File Prior to Visit  Medication Sig Dispense Refill  . meloxicam (MOBIC) 15 MG tablet Take 1 tablet (15 mg total) by mouth daily. (Patient not taking: Reported on 03/17/2023) 30 tablet 0  . methylPREDNISolone (MEDROL DOSEPAK) 4 MG TBPK tablet Take per packet instructions (Patient not taking: Reported on 03/17/2023) 21 each 0   No current facility-administered medications on file prior to visit.    BP 122/80   Pulse 90   Temp 98.4 F (36.9 C) (Temporal)   Resp 17   Ht 6\' 4"  (1.93 m)   Wt 203 lb 2 oz (92.1 kg)   SpO2 97%   BMI 24.73 kg/m chart      Objective:    BP 122/80   Pulse 90   Temp 98.4  F (36.9 C) (Temporal)   Resp 17   Ht 6\' 4"  (1.93 m)   Wt 203 lb 2 oz (92.1 kg)   SpO2 97%   BMI 24.73 kg/m    Physical Exam Vitals and nursing note reviewed.  Constitutional:      Appearance: Normal appearance. He is normal weight.  HENT:     Head: Normocephalic.     Right Ear: Tympanic membrane and ear canal normal.     Left Ear: Tympanic membrane and ear canal normal.  Cardiovascular:     Rate and Rhythm: Normal rate and regular rhythm.     Pulses: Normal pulses.     Heart sounds: Normal heart sounds.  Pulmonary:     Effort: Pulmonary effort is normal.     Breath sounds: Normal breath sounds.  Abdominal:     General: Abdomen is flat. Bowel sounds are normal.     Palpations: There is no mass.     Tenderness: There is no abdominal tenderness. There is no guarding or rebound.  Musculoskeletal:         General: Normal range of motion.  Skin:    Findings: Rash present.     Comments: Dry, lightly hyperpigmented rash noted to the eyelids bilaterally. Upper back, right buttocks, and upper right leg.   Neurological:     General: No focal deficit present.     Mental Status: He is alert and oriented to person, place, and time.  Psychiatric:        Mood and Affect: Mood normal.        Behavior: Behavior normal.   No results found for any visits on 03/17/23.      Assessment & Plan:   Problem List Items Addressed This Visit   None Visit Diagnoses     Tinea corporis    -  Primary   Relevant Medications   fluconazole (DIFLUCAN) 150 MG tablet   clotrimazole-betamethasone (LOTRISONE) cream   Other Relevant Orders   Ambulatory referral to Gastroenterology   Blood in the stool           Meds ordered this encounter  Medications  . fluconazole (DIFLUCAN) 150 MG tablet    Sig: Take 1 tablet (150 mg total) by mouth once a week.    Dispense:  4 tablet    Refill:  0  . clotrimazole-betamethasone (LOTRISONE) cream    Sig: Apply 1 Application topically daily.    Dispense:  30 g    Refill:  0   Refer to GI for blood in the stools. Will treat fungal rash. Call the office if symptoms worsen or persist. Recheck as scheduled and sooner as needed No follow-ups on file.  Kennyth Arnold, FNP

## 2023-03-31 ENCOUNTER — Ambulatory Visit: Payer: Federal, State, Local not specified - PPO | Admitting: Gastroenterology

## 2023-04-04 ENCOUNTER — Ambulatory Visit: Payer: Federal, State, Local not specified - PPO | Admitting: Gastroenterology

## 2023-04-15 ENCOUNTER — Ambulatory Visit (INDEPENDENT_AMBULATORY_CARE_PROVIDER_SITE_OTHER): Payer: Federal, State, Local not specified - PPO | Admitting: Student

## 2023-04-15 ENCOUNTER — Encounter (HOSPITAL_BASED_OUTPATIENT_CLINIC_OR_DEPARTMENT_OTHER): Payer: Self-pay | Admitting: Student

## 2023-04-15 DIAGNOSIS — M75112 Incomplete rotator cuff tear or rupture of left shoulder, not specified as traumatic: Secondary | ICD-10-CM | POA: Diagnosis not present

## 2023-04-15 NOTE — Progress Notes (Signed)
Chief Complaint: Left shoulder pain and weakness     History of Present Illness:    04/15/2023: Patient presents today for 4-week follow-up of left shoulder pain.  He states that overall he is doing much better.  His pain levels have significantly improved and are none to minimal at baseline.  He does still notice that specific movements are on comfortable.  He has still been trying to avoid lifting anything over 20 to 25 pounds.  He has had difficulty finding comfortable positions while sleeping due to pain but has been taking melatonin which is helping.  He does still feel weakness with the left shoulder.  He states that the numbness into his fingers has resolved.  While taking the meloxicam prescribed at last visit he did notice some blood in the stool which after stopping the medication has resolved.   Surgical History:   None  PMH/PSH/Family History/Social History/Meds/Allergies:   History reviewed. No pertinent past medical history. Past Surgical History:  Procedure Laterality Date   HERNIA REPAIR     Social History   Socioeconomic History   Marital status: Married    Spouse name: Not on file   Number of children: Not on file   Years of education: Not on file   Highest education level: Not on file  Occupational History   Not on file  Tobacco Use   Smoking status: Never   Smokeless tobacco: Never  Substance and Sexual Activity   Alcohol use: No   Drug use: Never   Sexual activity: Yes  Other Topics Concern   Not on file  Social History Narrative   Right handed   Social Determinants of Health   Financial Resource Strain: Not on file  Food Insecurity: Not on file  Transportation Needs: Not on file  Physical Activity: Not on file  Stress: Not on file  Social Connections: Not on file   Family History  Problem Relation Age of Onset   Diabetes Mother    Hyperlipidemia Father    Diabetes Father    No Known Allergies Current  Outpatient Medications  Medication Sig Dispense Refill   clotrimazole-betamethasone (LOTRISONE) cream Apply 1 Application topically daily. 30 g 0   fluconazole (DIFLUCAN) 150 MG tablet Take 1 tablet (150 mg total) by mouth once a week. 4 tablet 0   meloxicam (MOBIC) 15 MG tablet Take 1 tablet (15 mg total) by mouth daily. (Patient not taking: Reported on 03/17/2023) 30 tablet 0   methylPREDNISolone (MEDROL DOSEPAK) 4 MG TBPK tablet Take per packet instructions (Patient not taking: Reported on 03/17/2023) 21 each 0   No current facility-administered medications for this visit.   No results found.  Review of Systems:   A ROS was performed including pertinent positives and negatives as documented in the HPI.  Physical Exam :   Constitutional: NAD and appears stated age Neurological: Alert and oriented Psych: Appropriate affect and cooperative There were no vitals taken for this visit.   Comprehensive Musculoskeletal Exam:    Musculoskeletal Exam    Inspection Right Left  Skin No atrophy or winging No atrophy or winging  Palpation    Tenderness None Lateral and anterior shoulder  Range of Motion    Flexion (passive) 170 170  Flexion (active) 170 130  Extension 30 30  Abduction 170 150  ER at side 45 45          Can reach behind back to L1 L3  Strength     Full Full  Special Tests    Pseudoparalytic No No  Neurologic    Fires PIN, radial, median, ulnar, musculocutaneous, axillary, suprascapular, long thoracic, and spinal accessory innervated muscles. No abnormal sensibility  Vascular/Lymphatic    Radial Pulse 2+ 2+  Cervical Exam    Patient has symmetric cervical range of motion with negative Spurling's test.  Special Test:  Slight decrease in left sided grip strength     Imaging:    I personally reviewed and interpreted the radiographs.   Assessment:   47 year old right-hand-dominant male with left shoulder pain and known full-thickness rotator cuff tear.  Overall  he is doing much better and feels like he has returned back to his baseline prior to the injury 4 weeks ago.  He does not want to consider surgery or injections and wishes to continue getting as much out of his shoulder as he can with conservative treatment.  He did experience some GI bleeding with the meloxicam so I encouraged use of Tylenol for pain and Voltaren gel if needed.  The radiating numbness into his left hand has resolved, but we did discuss that should this return or worsen that we could evaluate with a cervical MRI.  We will plan to see him back in clinic as needed.  All other questions and concerns addressed.   Plan :    -Return to clinic as needed   I personally saw and evaluated the patient, and participated in the management and treatment plan.  Hazle Nordmann, PA-C Orthopedics  This document was dictated using Conservation officer, historic buildings. A reasonable attempt at proof reading has been made to minimize errors.

## 2023-05-21 NOTE — Progress Notes (Unsigned)
Referring Provider: Eulis Foster, FNP  Primary Care Physician:  Shade Flood, MD Primary Gastroenterologist:  Dr. Marletta Lor  Chief Complaint  Patient presents with   New Patient (Initial Visit)    Referred for tinea corporis and bleeding when taking a new medication from the orthopedic. Pt has rash in different areas of his body    HPI:   Sergio Hernandez is a 47 y.o. male presenting today at the request of Eulis Foster, FNP for blood in stools.   Reviewed OV with Eulis Foster, FNP dated 03/17/2023.  Patient reported 3 episodes of blood in his stools over the last 3 months.  He had not had a colonoscopy.  Reported sister with Crohn's disease.  He was referred to GI for further evaluation.  Today: Rectal bleeding occurs once every 2 weeks. First started a few months ago. Went away, but started back after starting Meloxicam in March for his shoulder, so he stopped the medication. Has been taking Advil for his arthritis as well, but none lately. Last took a few months ago.   No abdominal pain. Eating well. Bowels are moving well. Drinks a lot of water. No unintentional weight loss.   Some rectal pain and itching. Can be any time, but when bleeding he has more discomfort. No knife like pain.   Colonoscopy many years ago in [place]. Not sure of the results. Sister has Crohn's disease. No Fhx of colon cancer.   Works in [employer]. Here for 2 weeks then in [place] for 2 weeks. Wife is in [place].   Construction for a living.     History reviewed. No pertinent past medical history.  Past Surgical History:  Procedure Laterality Date   HERNIA REPAIR      Current Outpatient Medications  Medication Sig Dispense Refill   clotrimazole-betamethasone (LOTRISONE) cream Apply 1 Application topically daily. 30 g 0   hydrocortisone (ANUSOL-HC) 2.5 % rectal cream Place 1 Application rectally 2 (two) times daily. 30 g 1   No current facility-administered medications for this visit.     Allergies as of 05/23/2023   (No Known Allergies)    Family History  Problem Relation Age of Onset   Diabetes Mother    Hyperlipidemia Father    Diabetes Father    Crohn's disease Sister    Colon cancer Neg Hx     Social History   Socioeconomic History   Marital status: Married    Spouse name: Not on file   Number of children: Not on file   Years of education: Not on file   Highest education level: Not on file  Occupational History   Not on file  Tobacco Use   Smoking status: Never   Smokeless tobacco: Never  Substance and Sexual Activity   Alcohol use: No   Drug use: Not Currently   Sexual activity: Yes  Other Topics Concern   Not on file  Social History Narrative   Right handed   Social Determinants of Health   Financial Resource Strain: Not on file  Food Insecurity: Not on file  Transportation Needs: Not on file  Physical Activity: Not on file  Stress: Not on file  Social Connections: Not on file  Intimate Partner Violence: Not on file    Review of Systems: Gen: Denies any fever, chills, cold or flulike symptoms, presyncope, syncope. CV: Denies chest pain, heart palpitations. Resp: Denies shortness of breath, cough. GI: See HPI GU : Denies urinary burning, urinary frequency, urinary hesitancy  MS: Admits to shoulder pain. Derm: Denies rash. Psych: Denies depression, anxiety. Heme: See HPI  Physical Exam: BP 126/81   Pulse 72   Temp 97.6 F (36.4 C)   Ht 6\' 4"  (1.93 m)   Wt 196 lb 6.4 oz (89.1 kg)   BMI 23.91 kg/m  General:   Alert and oriented. Pleasant and cooperative. Well-nourished and well-developed.  Head:  Normocephalic and atraumatic. Eyes:  Without icterus, sclera clear and conjunctiva pink.  Ears:  Normal auditory acuity. Lungs:  Clear to auscultation bilaterally. No wheezes, rales, or rhonchi. No distress.  Heart:  S1, S2 present without murmurs appreciated.  Abdomen:  +BS, soft, non-tender and non-distended. No HSM noted. No  guarding or rebound. No masses appreciated.  Rectal:  Deferred to time of colonoscopy Msk:  Symmetrical without gross deformities. Normal posture. Extremities:  Without edema. Neurologic:  Alert and  oriented x4;  grossly normal neurologically. Skin:  Intact without significant lesions or rashes. Psych:  Normal mood and affect.    Assessment:  47 y.o. male with no significant past medical history, presenting today for evaluation of rectal bleeding which started a few months ago and is occurring once every couple of weeks. Symptoms worsening when taking Meloxicam and Advil, but has since discontinued these medications. Denies abdominal pain, constipation, diarrhea, unintentional weight loss. He does have some rectal discomfort/itching. May very well have hemorrhoids which could certainly account for rectal bleeding as well. However, unable to rule out other etiologies such as colon polyps, malignancy, IBD.  Notably, patient reports his sister has Crohn's disease.  No family history of colon cancer.  He reports having a colonoscopy many years ago in [place], but not sure of the results.   I have recommended colonoscopy to further evaluate rectal bleeding, but patient requesting to hold off on this for now until he is able to arrange for assistance with transportation as he is currently back and forth between [place] every 2 weeks and his wife resides in [place].  He is requesting treatment for hemorrhoids.   Plan:  CBC. Anusol rectal cream twice daily x 10 days, then again as needed. Limit toilet time to 2-3 minutes. Avoid straining. Avoid all NSAIDs. Use Tylenol for arthritis pain.  No more than 3000 mg per 24 hours. Patient will let us know when he is ready to schedule colonoscopy. Follow-up in 3 months or sooner if needed.   Ermalinda Memos, PA-C [facility] 05/23/2023

## 2023-05-23 ENCOUNTER — Encounter: Payer: Self-pay | Admitting: Gastroenterology

## 2023-05-23 ENCOUNTER — Ambulatory Visit (INDEPENDENT_AMBULATORY_CARE_PROVIDER_SITE_OTHER): Payer: Federal, State, Local not specified - PPO | Admitting: Gastroenterology

## 2023-05-23 VITALS — BP 126/81 | HR 72 | Temp 97.6°F | Ht 76.0 in | Wt 196.4 lb

## 2023-05-23 DIAGNOSIS — L290 Pruritus ani: Secondary | ICD-10-CM

## 2023-05-23 DIAGNOSIS — K625 Hemorrhage of anus and rectum: Secondary | ICD-10-CM | POA: Diagnosis not present

## 2023-05-23 MED ORDER — HYDROCORTISONE (PERIANAL) 2.5 % EX CREA: 1.0000 | TOPICAL_CREAM | Freq: Two times a day (BID) | CUTANEOUS | 1 refills | Status: AC

## 2023-05-23 NOTE — Patient Instructions (Addendum)
Please have blood work completed at [facility].   [address] [place], [place] [address]  All rectal cream twice daily x 10 days for possible hemorrhoids. You can use this again as needed.   Limit toilet time to 2-3 minutes.  Avoid straining.  To help with your arthritis pain, I recommend that you take Tylenol (acetaminophen) only as needed.  No more than 3000 mg of Tylenol per 24 hours.  Avoid all NSAID products including ibuprofen, Aleve, Advil, BC powders, Goody powders, and anything that says "NSAID" on the package.  You need a colonoscopy.  Please call us when you are able to get this scheduled.  We will follow-up with you in 3 months or sooner if needed.  It was good to meet you today!  Ermalinda Memos, PA-C [facility]

## 2023-05-24 ENCOUNTER — Encounter: Payer: Self-pay | Admitting: *Deleted

## 2023-05-24 ENCOUNTER — Encounter: Payer: Self-pay | Admitting: Gastroenterology

## 2023-05-24 LAB — CBC WITH DIFFERENTIAL/PLATELET
Basophils Absolute: 0 10*3/uL (ref 0.0–0.2)
Basos: 1 %
EOS (ABSOLUTE): 0.1 10*3/uL (ref 0.0–0.4)
Eos: 1 %
Hematocrit: 43.8 % (ref 37.5–51.0)
Hemoglobin: 14.4 g/dL (ref 13.0–17.7)
Immature Grans (Abs): 0 10*3/uL (ref 0.0–0.1)
Immature Granulocytes: 0 %
Lymphocytes Absolute: 2.1 10*3/uL (ref 0.7–3.1)
Lymphs: 25 %
MCH: 27.1 pg (ref 26.6–33.0)
MCHC: 32.9 g/dL (ref 31.5–35.7)
MCV: 83 fL (ref 79–97)
Monocytes Absolute: 0.6 10*3/uL (ref 0.1–0.9)
Monocytes: 7 %
Neutrophils Absolute: 5.4 10*3/uL (ref 1.4–7.0)
Neutrophils: 66 %
Platelets: 342 10*3/uL (ref 150–450)
RBC: 5.31 x10E6/uL (ref 4.14–5.80)
RDW: 13.1 % (ref 11.6–15.4)
WBC: 8.2 10*3/uL (ref 3.4–10.8)

## 2023-05-26 ENCOUNTER — Encounter: Payer: Self-pay | Admitting: *Deleted

## 2023-05-31 ENCOUNTER — Telehealth: Payer: Self-pay | Admitting: Family Medicine

## 2023-05-31 NOTE — Telephone Encounter (Signed)
Placed in folder

## 2023-05-31 NOTE — Telephone Encounter (Signed)
[facility]. Placed in front bin.

## 2023-08-09 ENCOUNTER — Encounter: Payer: Self-pay | Admitting: Gastroenterology

## 2025-01-02 ENCOUNTER — Encounter: Payer: Self-pay | Admitting: Family Medicine

## 2025-01-02 ENCOUNTER — Ambulatory Visit: Admitting: Family Medicine

## 2025-01-02 VITALS — BP 122/78 | HR 85 | Temp 98.8°F | Resp 12 | Ht 76.0 in | Wt 195.0 lb

## 2025-01-02 DIAGNOSIS — F439 Reaction to severe stress, unspecified: Secondary | ICD-10-CM | POA: Diagnosis not present

## 2025-01-02 DIAGNOSIS — F419 Anxiety disorder, unspecified: Secondary | ICD-10-CM

## 2025-01-02 MED ORDER — HYDROXYZINE HCL 25 MG PO TABS: 12.5000 mg | ORAL_TABLET | Freq: Three times a day (TID) | ORAL | 0 refills | Status: AC | PRN

## 2025-01-02 MED ORDER — SERTRALINE HCL 25 MG PO TABS: 25.0000 mg | ORAL_TABLET | Freq: Every day | ORAL | 3 refills | Status: AC

## 2025-01-02 NOTE — Patient Instructions (Addendum)
 Thank you for coming in today.  I am sorry to hear about the stressors in life and the anxiety symptoms.  See information below.  Start sertraline  once per day for now, I will refer you to psychiatry for evaluation, other treatment options and to discuss possible PTSD.  Hydroxyzine  also prescribed if needed for breakthrough anxiety, but start with that at bedtime as needed for sleep.  If that is taken during the day it can cause sedation so do not drive or operate machinery while taking that medication.  Return to the clinic or go to the nearest emergency room if any of your symptoms worsen or new symptoms occur.   Managing Stress, Adult Feeling a certain amount of stress is normal. Stress helps our body and mind get ready to deal with the demands of life. Stress hormones can motivate you to do well at work and meet your responsibilities. But severe or long-term (chronic) stress can affect your mental and physical health. Chronic stress puts you at higher risk for: Anxiety and depression. Other health problems such as digestive problems, muscle aches, heart disease, high blood pressure, and stroke. What are the causes? Common causes of stress include: Demands from work, such as deadlines, feeling overworked, or having long hours. Pressures at home, such as money issues, disagreements with a spouse, or parenting issues. Pressures from major life changes, such as divorce, moving, loss of a loved one, or chronic illness. You may be at higher risk for stress-related problems if you: Do not get enough sleep. Are in poor health. Do not have emotional support. Have a mental health disorder such as anxiety or depression. How to recognize stress Stress can make you: Have trouble sleeping. Feel sad, anxious, irritable, or overwhelmed. Lose your appetite. Overeat or want to eat unhealthy foods. Want to use drugs or alcohol. Stress can also cause physical symptoms, such as: Sore, tense muscles,  especially in the shoulders and neck. Headaches. Trouble breathing. A faster heart rate. Stomach pain, nausea, or vomiting. Diarrhea or constipation. Trouble concentrating. Follow these instructions at home: Eating and drinking Eat a healthy diet. This includes: Eating foods that are high in fiber, such as beans, whole grains, and fresh fruits and vegetables. Limiting foods that are high in fat and processed sugars, such as fried or sweet foods. Do not skip meals or overeat. Drink enough fluid to keep your urine pale yellow. Alcohol use Do not drink alcohol if: Your health care provider tells you not to drink. You are pregnant, may be pregnant, or are planning to become pregnant. Drinking alcohol is a way some people try to ease their stress. This can be dangerous, so if you drink alcohol: Limit how much you have to: 0-1 drink a day for women. 0-2 drinks a day for men. Know how much alcohol is in your drink. In the [place] one drink equals one 12 oz bottle of beer (355 mL), one 5 oz glass of wine (148 mL), or one 1 oz glass of hard liquor (44 mL). Activity  Include 30 minutes of exercise in your daily schedule. Exercise is a good stress reducer. Include time in your day for an activity that you find relaxing. Try taking a walk, going on a bike ride, reading a book, or listening to music. Schedule your time in a way that lowers stress, and keep a regular schedule. Focus on doing what is most important to get done. Lifestyle Identify the source of your stress and your reaction to it.  See a therapist who can help you change unhelpful reactions. When there are stressful events: Talk about them with family, friends, or coworkers. Try to think realistically about stressful events and not ignore them or overreact. Try to find the positives in a stressful situation and not focus on the negatives. Cut back on responsibilities at work and home, if possible. Ask for help from friends or family  members if you need it. Find ways to manage stress, such as: Mindfulness, meditation, or deep breathing. Yoga or tai chi. Progressive muscle relaxation. Spending time in nature. Doing art, playing music, or reading. Making time for fun activities. Spending time with family and friends. Get support from family, friends, or spiritual resources. General instructions Get enough sleep. Try to go to sleep and get up at about the same time every day. Take over-the-counter and prescription medicines only as told by your health care provider. Do not use any products that contain nicotine or tobacco. These products include cigarettes, chewing tobacco, and vaping devices, such as e-cigarettes. If you need help quitting, ask your health care provider. Do not use drugs or smoke to deal with stress. Keep all follow-up visits. This is important. Where to find support Talk with your health care provider about stress management or finding a support group. Find a therapist to work with you on your stress management techniques. Where to find more information [employer] on Mental Illness: www.nami.org American Psychological Association: dicetournament.ca Contact a health care provider if: Your stress symptoms get worse. You are unable to manage your stress at home. You are struggling to stop using drugs or alcohol. Get help right away if: You may be a danger to yourself or others. You have any thoughts of death or suicide. Get help right awayif you feel like you may hurt yourself or others, or have thoughts about taking your own life. Go to your nearest emergency room or: Call 911. Call the National Suicide Prevention Lifeline at 828 443 7207 or 988 in the [place] This is open 24 hours a day. If youre a Veteran: Call 988 and press 1. This is open 24 hours a day. Text the [employer] at 401-514-1515. Summary Feeling a certain amount of stress is normal, but severe or long-term (chronic) stress  can affect your mental and physical health. Chronic stress can put you at higher risk for anxiety, depression, and other health problems such as digestive problems, muscle aches, heart disease, high blood pressure, and stroke. You may be at higher risk for stress-related problems if you do not get enough sleep, are in poor health, lack emotional support, or have a mental health disorder such as anxiety or depression. Identify the source of your stress and your reaction to it. Try talking about stressful events with family, friends, or coworkers, finding a coping method, or getting support from spiritual resources. If you need more help, talk with your health care provider about finding a support group or a mental health therapist. This information is not intended to replace advice given to you by your health care provider. Make sure you discuss any questions you have with your health care provider. Document Revised: 07/21/2023 Document Reviewed: 06/30/2021 Elsevier Patient Education  2024 [employer].

## 2025-01-02 NOTE — Progress Notes (Signed)
 "  Subjective:  Patient ID: Sergio Hernandez, male    DOB: 03-11-1976  Age: 49 y.o. MRN: [id number]  CC:  Chief Complaint  Patient presents with   Acute Visit    Last few moments patient has been under a lot of stress with social. 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 Anxiety comes and ages. Night terrors. Possible psych referral     HPI Sergio Hernandez presents for  Acute visit for above.  I am listed as his primary care provider but last visit with me was in 2022. Was treated with Elavil  at that time for possible postconcussion headache.    Anxiety Today presents for acute visit for anxiety, notes increased anxiety, stress over the past few months.  Increased stress with neighbors at work and at home since April of last year - living in office last year. Same house, but back and forth living at either house or work. Has contacted landlord and police, which he feel like has worsened the situation. Feels like he has been harassed and damage to property. He is concerned that police not acting in his interest and was friend of one of the neighbors, and refused to give him a report. He has contacted Sheriff's office as well.  Hard time with sleeping, feeling anxious, since April of last year. Having some flashbacks from serving in [employer],  when he served in 1994-1997.  He is concerned about possible PTSD from serving in the [employer] with anxiety that comes and goes.  Had not had these symptoms prior to April of last year. Has had some nightmares or night terrors - since April of last year.  No alcohol. Has tried CBD or other supplement at night to help with sleep - helps sleep - only when he was in [place] no current meds. Anxious during the day. No SI/HI.  No prior meds for depression, anxiety.  PHQ today with score of 12 with no suicidal ideation. GAD-7 score of 18  Family is in [place] - splits time here and in [place].       01/02/2025    4:42 PM 03/17/2023    1:06 PM 07/21/2022    9:26 AM 04/12/2022    10:21 AM 09/02/2021    2:54 PM  Depression screen PHQ 2/9  Decreased Interest 3 0 0 0 0  Down, Depressed, Hopeless 1 0 0 0 3  PHQ - 2 Score 4 0 0 0 3  Altered sleeping 1 0 0 0 3  Tired, decreased energy 3 0 0 0 3  Change in appetite 2 0 0 0 0  Feeling bad or failure about yourself  0 0 0 0 2  Trouble concentrating 0 0 0 0 3  Moving slowly or fidgety/restless 0 0 0 0 3  Suicidal thoughts 0 0 0 0 0  PHQ-9 Score 10 0  0  0  17   Difficult doing work/chores Somewhat difficult Not difficult at all Not difficult at all Not difficult at all      Data saved with a previous flowsheet row definition      01/02/2025    4:52 PM 03/17/2023    1:07 PM  GAD 7 : Generalized Anxiety Score  Nervous, Anxious, on Edge 1 0  Control/stop worrying 3 0  Worry too much - different things 3 0  Trouble relaxing 3 0  Restless 0 0  Easily annoyed or irritable 3 0  Afraid - awful might happen 3 0  Total  GAD 7 Score 16 0  Anxiety Difficulty Somewhat difficult Not difficult at all       History Patient Active Problem List   Diagnosis Date Noted   Rotator cuff tear 01/26/2022   History reviewed. No pertinent past medical history. Past Surgical History:  Procedure Laterality Date   HERNIA REPAIR     Allergies[1] Prior to Admission medications  Medication Sig Start Date End Date Taking? Authorizing Provider  clotrimazole -betamethasone  (LOTRISONE ) cream Apply 1 Application topically daily. Patient not taking: Reported on 01/02/2025 03/17/23   Webb, Padonda B, FNP  hydrocortisone  (ANUSOL -HC) 2.5 % rectal cream Place 1 Application rectally 2 (two) times daily. Patient not taking: Reported on 01/02/2025 05/23/23   Rudy Josette RAMAN, PA-C   Social History   Socioeconomic History   Marital status: Married    Spouse name: Not on file   Number of children: Not on file   Years of education: Not on file   Highest education level: Not on file  Occupational History   Not on file  Tobacco Use   Smoking  status: Never   Smokeless tobacco: Never  Substance and Sexual Activity   Alcohol use: No   Drug use: Not Currently   Sexual activity: Yes  Other Topics Concern   Not on file  Social History Narrative   Right handed   Social Drivers of Health   Tobacco Use: Low Risk (01/02/2025)   Patient History    Smoking Tobacco Use: Never    Smokeless Tobacco Use: Never    Passive Exposure: Not on file  Financial Resource Strain: Not on file  Food Insecurity: Not on file  Transportation Needs: Not on file  Physical Activity: Not on file  Stress: Not on file  Social Connections: Not on file  Intimate Partner Violence: Not on file  Depression (PHQ2-9): Medium Risk (01/02/2025)   Depression (PHQ2-9)    PHQ-2 Score: 10  Alcohol Screen: Not on file  Housing: Not on file  Utilities: Not on file  Health Literacy: Not on file    Review of Systems   Objective:   Vitals:   01/02/25 1609  BP: 122/78  Pulse: 85  Resp: 12  Temp: 98.8 F (37.1 C)  TempSrc: Temporal  SpO2: 97%  Weight: 195 lb (88.5 kg)  Height: 6' 4 (1.93 m)     Physical Exam Vitals reviewed.  Constitutional:      Appearance: He is well-developed.  HENT:     Head: Normocephalic and atraumatic.  Neck:     Vascular: No carotid bruit or JVD.  Cardiovascular:     Rate and Rhythm: Normal rate and regular rhythm.     Heart sounds: Normal heart sounds. No murmur heard. Pulmonary:     Effort: Pulmonary effort is normal.     Breath sounds: Normal breath sounds. No rales.  Musculoskeletal:     Right lower leg: No edema.     Left lower leg: No edema.  Skin:    General: Skin is warm and dry.  Neurological:     Mental Status: He is alert and oriented to person, place, and time.  Psychiatric:        Mood and Affect: Mood normal.        Behavior: Behavior normal.        Thought Content: Thought content normal.        Assessment & Plan:  Tramaine Sauls is a 49 y.o. male . Anxiety - Plan: hydrOXYzine  (ATARAX ) 25  MG tablet,  sertraline  (ZOLOFT ) 25 MG tablet, Ambulatory referral to Psychiatry, CANCELED: Ambulatory referral to Psychiatry  Situational stress - Plan: hydrOXYzine  (ATARAX ) 25 MG tablet, sertraline  (ZOLOFT ) 25 MG tablet, Ambulatory referral to Psychiatry, CANCELED: Ambulatory referral to Psychiatry  Unfortunate stressors as above, with anxiety symptoms since April of last year.  Now with some nightmares, some concern for possible PTSD versus persistent anxiety, with situational stressors.  Denies SI/HI.  Given timing of symptoms will start on low-dose sertraline , referred to psychiatry to evaluate for possible PTSD and any need to change meds/plan, and prescribed short-term hydroxyzine  as needed for breakthrough anxiety during the day (side effects discussed, do not drive or operate machinery) but can start with this med at night initially for sleep.  Handout given on management of stress with RTC precautions.  Also encouraged him to continue to discuss his concerns with landlord or police department if he is having continued concerns with neighbors.   Meds ordered this encounter  Medications   hydrOXYzine  (ATARAX ) 25 MG tablet    Sig: Take 0.5-1 tablets (12.5-25 mg total) by mouth 3 (three) times daily as needed for anxiety (or sleep. start at bedtime as needed.).    Dispense:  30 tablet    Refill:  0   sertraline  (ZOLOFT ) 25 MG tablet    Sig: Take 1 tablet (25 mg total) by mouth daily.    Dispense:  30 tablet    Refill:  3   Patient Instructions  Thank you for coming in today.  I am sorry to hear about the stressors in life and the anxiety symptoms.  See information below.  Start sertraline  once per day for now, I will refer you to psychiatry for evaluation, other treatment options and to discuss possible PTSD.  Hydroxyzine  also prescribed if needed for breakthrough anxiety, but start with that at bedtime as needed for sleep.  If that is taken during the day it can cause sedation so do not drive  or operate machinery while taking that medication.  Return to the clinic or go to the nearest emergency room if any of your symptoms worsen or new symptoms occur.   Managing Stress, Adult Feeling a certain amount of stress is normal. Stress helps our body and mind get ready to deal with the demands of life. Stress hormones can motivate you to do well at work and meet your responsibilities. But severe or long-term (chronic) stress can affect your mental and physical health. Chronic stress puts you at higher risk for: Anxiety and depression. Other health problems such as digestive problems, muscle aches, heart disease, high blood pressure, and stroke. What are the causes? Common causes of stress include: Demands from work, such as deadlines, feeling overworked, or having long hours. Pressures at home, such as money issues, disagreements with a spouse, or parenting issues. Pressures from major life changes, such as divorce, moving, loss of a loved one, or chronic illness. You may be at higher risk for stress-related problems if you: Do not get enough sleep. Are in poor health. Do not have emotional support. Have a mental health disorder such as anxiety or depression. How to recognize stress Stress can make you: Have trouble sleeping. Feel sad, anxious, irritable, or overwhelmed. Lose your appetite. Overeat or want to eat unhealthy foods. Want to use drugs or alcohol. Stress can also cause physical symptoms, such as: Sore, tense muscles, especially in the shoulders and neck. Headaches. Trouble breathing. A faster heart rate. Stomach pain, nausea, or vomiting. Diarrhea or constipation.  Trouble concentrating. Follow these instructions at home: Eating and drinking Eat a healthy diet. This includes: Eating foods that are high in fiber, such as beans, whole grains, and fresh fruits and vegetables. Limiting foods that are high in fat and processed sugars, such as fried or sweet foods. Do  not skip meals or overeat. Drink enough fluid to keep your urine pale yellow. Alcohol use Do not drink alcohol if: Your health care provider tells you not to drink. You are pregnant, may be pregnant, or are planning to become pregnant. Drinking alcohol is a way some people try to ease their stress. This can be dangerous, so if you drink alcohol: Limit how much you have to: 0-1 drink a day for women. 0-2 drinks a day for men. Know how much alcohol is in your drink. In the [place] one drink equals one 12 oz bottle of beer (355 mL), one 5 oz glass of wine (148 mL), or one 1 oz glass of hard liquor (44 mL). Activity  Include 30 minutes of exercise in your daily schedule. Exercise is a good stress reducer. Include time in your day for an activity that you find relaxing. Try taking a walk, going on a bike ride, reading a book, or listening to music. Schedule your time in a way that lowers stress, and keep a regular schedule. Focus on doing what is most important to get done. Lifestyle Identify the source of your stress and your reaction to it. See a therapist who can help you change unhelpful reactions. When there are stressful events: Talk about them with family, friends, or coworkers. Try to think realistically about stressful events and not ignore them or overreact. Try to find the positives in a stressful situation and not focus on the negatives. Cut back on responsibilities at work and home, if possible. Ask for help from friends or family members if you need it. Find ways to manage stress, such as: Mindfulness, meditation, or deep breathing. Yoga or tai chi. Progressive muscle relaxation. Spending time in nature. Doing art, playing music, or reading. Making time for fun activities. Spending time with family and friends. Get support from family, friends, or spiritual resources. General instructions Get enough sleep. Try to go to sleep and get up at about the same time every day. Take  over-the-counter and prescription medicines only as told by your health care provider. Do not use any products that contain nicotine or tobacco. These products include cigarettes, chewing tobacco, and vaping devices, such as e-cigarettes. If you need help quitting, ask your health care provider. Do not use drugs or smoke to deal with stress. Keep all follow-up visits. This is important. Where to find support Talk with your health care provider about stress management or finding a support group. Find a therapist to work with you on your stress management techniques. Where to find more information [employer] Mental Illness: www.nami.org American Psychological Association: dicetournament.ca Contact a health care provider if: Your stress symptoms get worse. You are unable to manage your stress at home. You are struggling to stop using drugs or alcohol. Get help right away if: You may be a danger to yourself or others. You have any thoughts of death or suicide. Get help right awayif you feel like you may hurt yourself or others, or have thoughts about taking your own life. Go to your nearest emergency room or: Call 911. Call the National Suicide Prevention Lifeline at 540-222-1026 or 988 in the [place] This is open 24  hours a day. If youre a Veteran: Call 988 and press 1. This is open 24 hours a day. Text the [employer] at (469) 445-2554. Summary Feeling a certain amount of stress is normal, but severe or long-term (chronic) stress can affect your mental and physical health. Chronic stress can put you at higher risk for anxiety, depression, and other health problems such as digestive problems, muscle aches, heart disease, high blood pressure, and stroke. You may be at higher risk for stress-related problems if you do not get enough sleep, are in poor health, lack emotional support, or have a mental health disorder such as anxiety or depression. Identify the source of your stress and your  reaction to it. Try talking about stressful events with family, friends, or coworkers, finding a coping method, or getting support from spiritual resources. If you need more help, talk with your health care provider about finding a support group or a mental health therapist. This information is not intended to replace advice given to you by your health care provider. Make sure you discuss any questions you have with your health care provider. Document Revised: 07/21/2023 Document Reviewed: 06/30/2021 Elsevier Patient Education  2024 [employer].    Signed,   Reyes Pines, MD [facility], [facility] 01/02/2025 7:00 PM      [1] No Known Allergies  "

## 2025-02-01 ENCOUNTER — Ambulatory Visit: Admitting: Family Medicine
